# Patient Record
Sex: Female | Born: 1970 | Race: Black or African American | Hispanic: No | Marital: Married | Smoking: Never smoker
Health system: Southern US, Community
[De-identification: ages and names within clinical notes are randomized; demographics above are authoritative.]

## PROBLEM LIST (undated history)

## (undated) DIAGNOSIS — F419 Anxiety disorder, unspecified: Secondary | ICD-10-CM

## (undated) DIAGNOSIS — G709 Myoneural disorder, unspecified: Secondary | ICD-10-CM

## (undated) DIAGNOSIS — L309 Dermatitis, unspecified: Secondary | ICD-10-CM

## (undated) DIAGNOSIS — G43009 Migraine without aura, not intractable, without status migrainosus: Secondary | ICD-10-CM

## (undated) DIAGNOSIS — M069 Rheumatoid arthritis, unspecified: Secondary | ICD-10-CM

## (undated) DIAGNOSIS — H469 Unspecified optic neuritis: Secondary | ICD-10-CM

## (undated) DIAGNOSIS — M1990 Unspecified osteoarthritis, unspecified site: Secondary | ICD-10-CM

## (undated) DIAGNOSIS — I10 Essential (primary) hypertension: Secondary | ICD-10-CM

## (undated) HISTORY — DX: Unspecified optic neuritis: H46.9

## (undated) HISTORY — DX: Migraine without aura, not intractable, without status migrainosus: G43.009

## (undated) HISTORY — DX: Unspecified osteoarthritis, unspecified site: M19.90

## (undated) HISTORY — DX: Myoneural disorder, unspecified: G70.9

## (undated) HISTORY — DX: Rheumatoid arthritis, unspecified: M06.9

## (undated) HISTORY — DX: Dermatitis, unspecified: L30.9

## (undated) HISTORY — DX: Essential (primary) hypertension: I10

## (undated) HISTORY — DX: Anxiety disorder, unspecified: F41.9

---

## 2000-11-04 ENCOUNTER — Emergency Department (HOSPITAL_COMMUNITY): Admission: EM | Admit: 2000-11-04 | Discharge: 2000-11-05 | Payer: Self-pay | Admitting: Emergency Medicine

## 2004-04-12 ENCOUNTER — Ambulatory Visit: Payer: Self-pay | Admitting: Internal Medicine

## 2005-05-10 ENCOUNTER — Encounter: Admission: RE | Admit: 2005-05-10 | Discharge: 2005-05-10 | Payer: Self-pay | Admitting: Neurology

## 2006-07-21 ENCOUNTER — Inpatient Hospital Stay (HOSPITAL_COMMUNITY): Admission: AD | Admit: 2006-07-21 | Discharge: 2006-07-23 | Payer: Self-pay | Admitting: Obstetrics and Gynecology

## 2006-11-18 ENCOUNTER — Ambulatory Visit: Payer: Self-pay | Admitting: Internal Medicine

## 2006-11-18 ENCOUNTER — Encounter: Payer: Self-pay | Admitting: Internal Medicine

## 2006-11-18 DIAGNOSIS — G43009 Migraine without aura, not intractable, without status migrainosus: Secondary | ICD-10-CM | POA: Insufficient documentation

## 2006-11-18 DIAGNOSIS — H469 Unspecified optic neuritis: Secondary | ICD-10-CM | POA: Insufficient documentation

## 2006-12-19 ENCOUNTER — Ambulatory Visit: Payer: Self-pay | Admitting: Internal Medicine

## 2006-12-19 DIAGNOSIS — K5289 Other specified noninfective gastroenteritis and colitis: Secondary | ICD-10-CM

## 2006-12-19 DIAGNOSIS — I10 Essential (primary) hypertension: Secondary | ICD-10-CM | POA: Insufficient documentation

## 2006-12-23 ENCOUNTER — Telehealth (INDEPENDENT_AMBULATORY_CARE_PROVIDER_SITE_OTHER): Payer: Self-pay | Admitting: *Deleted

## 2006-12-25 ENCOUNTER — Telehealth (INDEPENDENT_AMBULATORY_CARE_PROVIDER_SITE_OTHER): Payer: Self-pay | Admitting: *Deleted

## 2007-11-18 ENCOUNTER — Ambulatory Visit: Payer: Self-pay | Admitting: Internal Medicine

## 2009-03-08 ENCOUNTER — Encounter: Payer: Self-pay | Admitting: Internal Medicine

## 2009-03-24 ENCOUNTER — Encounter: Payer: Self-pay | Admitting: Internal Medicine

## 2009-05-06 ENCOUNTER — Encounter: Payer: Self-pay | Admitting: Internal Medicine

## 2010-03-03 NOTE — Letter (Signed)
Summary: [facility]  [facility]   Imported By: Sherian Rein 03/14/2009 09:21:37  _____________________________________________________________________  External Attachment:    Type:   Image     Comment:   External Document

## 2010-03-03 NOTE — Letter (Signed)
Summary: [facility]  [facility]   Imported By: Lanelle Bal 05/12/2009 09:41:37  _____________________________________________________________________  External Attachment:    Type:   Image     Comment:   External Document

## 2010-03-03 NOTE — Letter (Signed)
Summary: [facility]  [facility]   Imported By: Sherian Rein 03/28/2009 14:12:12  _____________________________________________________________________  External Attachment:    Type:   Image     Comment:   External Document

## 2010-09-22 ENCOUNTER — Other Ambulatory Visit: Payer: Self-pay | Admitting: Neurology

## 2010-09-22 DIAGNOSIS — H4610 Retrobulbar neuritis, unspecified eye: Secondary | ICD-10-CM

## 2010-09-27 ENCOUNTER — Ambulatory Visit
Admission: RE | Admit: 2010-09-27 | Discharge: 2010-09-27 | Disposition: A | Discharge status: Home / Self Care | Payer: BC Managed Care – PPO | Source: Ambulatory Visit | Attending: Neurology | Admitting: Neurology

## 2010-09-27 DIAGNOSIS — H4610 Retrobulbar neuritis, unspecified eye: Secondary | ICD-10-CM

## 2010-09-27 MED ORDER — GADOBENATE DIMEGLUMINE 529 MG/ML IV SOLN
15.0000 mL | Freq: Once | INTRAVENOUS | Status: AC | PRN
  Administered 2010-09-27: 15 mL via INTRAVENOUS

## 2010-11-15 LAB — COMPREHENSIVE METABOLIC PANEL
ALT: 14
AST: 25
BUN: 4 — ABNORMAL LOW
Calcium: 9
Chloride: 103
GFR calc Af Amer: >60
Sodium: 135
Total Protein: 5 — ABNORMAL LOW

## 2010-11-15 LAB — CBC
HCT: 35.3 — ABNORMAL LOW
HCT: 36.7
HCT: 37.2
Hemoglobin: 12.2
Hemoglobin: 12.2
MCHC: 32.8
MCHC: 33.1
MCV: 88.1
Platelets: 190
Platelets: 218
RBC: 4.03
RBC: 4.23
RDW: 12.7
RDW: 13
RDW: 13.1
WBC: 10.3
WBC: 13.9 — ABNORMAL HIGH

## 2010-11-15 LAB — RPR: RPR Ser Ql: NONREACTIVE

## 2010-11-15 LAB — URIC ACID: Uric Acid, Serum: 4.8

## 2011-06-06 ENCOUNTER — Encounter: Payer: Self-pay | Admitting: Internal Medicine

## 2011-06-06 DIAGNOSIS — Z0000 Encounter for general adult medical examination without abnormal findings: Secondary | ICD-10-CM | POA: Insufficient documentation

## 2011-06-07 ENCOUNTER — Encounter: Payer: Self-pay | Admitting: Internal Medicine

## 2011-06-07 ENCOUNTER — Ambulatory Visit (INDEPENDENT_AMBULATORY_CARE_PROVIDER_SITE_OTHER): Payer: BC Managed Care – PPO | Admitting: Internal Medicine

## 2011-06-07 VITALS — BP 138/92 | HR 66 | Temp 98.5°F | Ht 63.5 in | Wt 159.0 lb

## 2011-06-07 DIAGNOSIS — Z0000 Encounter for general adult medical examination without abnormal findings: Secondary | ICD-10-CM

## 2011-06-07 DIAGNOSIS — M79609 Pain in unspecified limb: Secondary | ICD-10-CM

## 2011-06-07 DIAGNOSIS — F419 Anxiety disorder, unspecified: Secondary | ICD-10-CM | POA: Insufficient documentation

## 2011-06-07 DIAGNOSIS — I10 Essential (primary) hypertension: Secondary | ICD-10-CM

## 2011-06-07 DIAGNOSIS — M79645 Pain in left finger(s): Secondary | ICD-10-CM

## 2011-06-07 DIAGNOSIS — F411 Generalized anxiety disorder: Secondary | ICD-10-CM

## 2011-06-07 MED ORDER — NAPROXEN 500 MG PO TABS: 500.0000 mg | ORAL_TABLET | Freq: Two times a day (BID) | ORAL | Status: DC

## 2011-06-07 MED ORDER — HYDROCHLOROTHIAZIDE 25 MG PO TABS: 25.0000 mg | ORAL_TABLET | Freq: Every day | ORAL | Status: DC

## 2011-06-07 NOTE — Patient Instructions (Signed)
Take all new medications as prescribed Continue all other medications as before Please return in 1 mo with Lab testing done 3-5 days before  

## 2011-06-09 ENCOUNTER — Encounter: Payer: Self-pay | Admitting: Internal Medicine

## 2011-06-09 NOTE — Assessment & Plan Note (Signed)
stable overall by hx and exam, most recent data reviewed with pt, and pt to continue medical treatment as before Lab Results  Component Value Date   WBC 10.3 07/23/2006   HGB 12.2 07/23/2006   HCT 36.7 07/23/2006   PLT 190 07/23/2006   GLUCOSE 71 07/23/2006   ALT 14 07/23/2006   AST 25 07/23/2006   NA 135 07/23/2006   K 4.0 07/23/2006   CL 103 07/23/2006   CREATININE 0.59 07/23/2006   BUN 4* 07/23/2006   CO2 26 07/23/2006    

## 2011-06-09 NOTE — Assessment & Plan Note (Signed)
To start hctz 25 qd,  to f/u any worsening symptoms or concerns BP Readings from Last 3 Encounters:  06/07/11 138/92  11/18/07 156/102  12/19/06 141/91

## 2011-06-09 NOTE — Progress Notes (Signed)
  Subjective:    Patient ID: Debbie Graves, female    DOB: February 28, 1970, 41 y.o.   MRN: [id number]  HPI  Here to f/u; overall doing ok,  Pt denies chest pain, increased sob or doe, wheezing, orthopnea, PND, increased LE swelling, palpitations, dizziness or syncope but BP has been mild persistent elvated at 142/91 or higher, with occasional headaches.    Pt denies new neurological symptoms such as new facial or extremity weakness or numbness   Pt denies polydipsia, polyuria, or low sugar symptoms  Pt states overall good compliance with meds, trying to follow lower cholesterol diet, wt overall stable but little exercise however.  Does also have some pain to joints of several fingers of both hands.  Denies worsening depressive symptoms, suicidal ideation, or panic, though has ongoing anxiety, not increased recently.  Past Medical History  Diagnosis Date  . NEURITIS, OPTIC NOS 11/18/2006    Qualifier: Diagnosis of  By: Jonny Ruiz MD, Len Blalock   . Essential hypertension, benign 12/19/2006    Qualifier: Diagnosis of  By: Jonny Ruiz MD, Len Blalock   . COMMON MIGRAINE 11/18/2006    Qualifier: Diagnosis of  By: Jonny Ruiz MD, Len Blalock    No past surgical history on file.  reports that she has never smoked. She has never used smokeless tobacco. She reports that she does not drink alcohol or use illicit drugs. family history includes Breast cancer in an unspecified family member; Coronary artery disease in an unspecified family member; Hyperlipidemia in an unspecified family member; Hypertension in an unspecified family member; Lung cancer in an unspecified family member; Sarcoidosis in an unspecified family member; and Scleroderma in an unspecified family member. Allergies  Allergen Reactions  . Pseudoephedrine-Dm     REACTION: not sure   Current Outpatient Prescriptions on File Prior to Visit  Medication Sig Dispense Refill  . escitalopram (LEXAPRO) 10 MG tablet Take 10 mg by mouth daily.      . hydrochlorothiazide  (HYDRODIURIL) 25 MG tablet Take 1 tablet (25 mg total) by mouth daily.  90 tablet  3   Review of Systems Review of Systems  Constitutional: Negative for diaphoresis and unexpected weight change.   Eyes: Negative for photophobia and visual disturbance.  Respiratory: Negative for choking and stridor.   Gastrointestinal: Negative for vomiting and blood in stool.  Genitourinary: Negative for hematuria and decreased urine volume.  Musculoskeletal: Negative for gait problem.  Skin: Negative for color change and wound.  Neurological: Negative for tremors and numbness.  Psychiatric/Behavioral: Negative for decreased concentration. The patient is not hyperactive.       Objective:   Physical Exam BP 138/92  Pulse 66  Temp(Src) 98.5 F (36.9 C) (Oral)  Ht 5' 3.5" (1.613 m)  Wt 159 lb (72.122 kg)  BMI 27.72 kg/m2  SpO2 96% Physical Exam  VS noted, not ill appearing Constitutional: Pt appears well-developed and well-nourished.  HENT: Head: Normocephalic.  Right Ear: External ear normal.  Left Ear: External ear normal.  Eyes: Conjunctivae and EOM are normal. Pupils are equal, round, and reactive to light.  Neck: Normal range of motion. Neck supple.  Cardiovascular: Normal rate and regular rhythm.   Pulmonary/Chest: Effort normal and breath sounds normal.  Neurological: Pt is alert. Not confused Skin: Skin is warm. No erythema.  Several fingers both hands with bony and soft tissue swelling c/w DJD, mild tender Psychiatric: Pt behavior is normal. Thought content normal.     Assessment & Plan:

## 2011-06-09 NOTE — Assessment & Plan Note (Signed)
C/w DJD - for naproxen prn,  to f/u any worsening symptoms or concerns

## 2011-07-10 ENCOUNTER — Encounter: Payer: Self-pay | Admitting: Internal Medicine

## 2011-07-10 ENCOUNTER — Ambulatory Visit (INDEPENDENT_AMBULATORY_CARE_PROVIDER_SITE_OTHER): Payer: BC Managed Care – PPO | Admitting: Internal Medicine

## 2011-07-10 VITALS — BP 132/88 | HR 68 | Temp 97.0°F | Ht 63.0 in | Wt 156.2 lb

## 2011-07-10 DIAGNOSIS — I10 Essential (primary) hypertension: Secondary | ICD-10-CM

## 2011-07-10 DIAGNOSIS — M25569 Pain in unspecified knee: Secondary | ICD-10-CM

## 2011-07-10 DIAGNOSIS — M25562 Pain in left knee: Secondary | ICD-10-CM

## 2011-07-10 DIAGNOSIS — F419 Anxiety disorder, unspecified: Secondary | ICD-10-CM

## 2011-07-10 DIAGNOSIS — F411 Generalized anxiety disorder: Secondary | ICD-10-CM

## 2011-07-10 NOTE — Patient Instructions (Addendum)
Continue all other medications as before You can also take OTC tylenol arthritis as needed for pain as well

## 2011-07-15 DIAGNOSIS — M25562 Pain in left knee: Secondary | ICD-10-CM | POA: Insufficient documentation

## 2011-07-15 NOTE — Assessment & Plan Note (Signed)
stable overall by hx and exam, most recent data reviewed with pt, and pt to continue medical treatment as before BP Readings from Last 3 Encounters:  07/10/11 132/88  06/07/11 138/92  11/18/07 156/102

## 2011-07-15 NOTE — Assessment & Plan Note (Signed)
stable overall by hx and exam, most recent data reviewed with pt, and pt to continue medical treatment as before Lab Results  Component Value Date   WBC 10.3 07/23/2006   HGB 12.2 07/23/2006   HCT 36.7 07/23/2006   PLT 190 07/23/2006   GLUCOSE 71 07/23/2006   ALT 14 07/23/2006   AST 25 07/23/2006   NA 135 07/23/2006   K 4.0 07/23/2006   CL 103 07/23/2006   CREATININE 0.59 07/23/2006   BUN 4* 07/23/2006   CO2 26 07/23/2006

## 2011-07-15 NOTE — Assessment & Plan Note (Signed)
With tendonitis, mild, for tylenol prn,  to f/u any worsening symptoms or concerns

## 2011-07-15 NOTE — Progress Notes (Signed)
Subjective:    Patient ID: Debbie Graves, female    DOB: Dec 12, 1970, 41 y.o.   MRN: [id number]  HPI  Here to f/u - Denies worsening depressive symptoms, suicidal ideation, or panic. Pt denies chest pain, increased sob or doe, wheezing, orthopnea, PND, increased LE swelling, palpitations, dizziness or syncope.   Pt denies polydipsia, polyuria.  Pt denies new neurological symptoms such as new headache, or facial or extremity weakness or numbness.  Overall good compliance with treatment, and good medicine tolerability.  Does have mild left post knee lateral aspect tenderness and soreness to walk, and mild several hand joint pains as well.  Pt denies fever, wt loss, night sweats, loss of appetite, or other constitutional symptoms Past Medical History  Diagnosis Date  . NEURITIS, OPTIC NOS 11/18/2006    Qualifier: Diagnosis of  By: Jonny Ruiz MD, Len Blalock   . Essential hypertension, benign 12/19/2006    Qualifier: Diagnosis of  By: Jonny Ruiz MD, Len Blalock   . COMMON MIGRAINE 11/18/2006    Qualifier: Diagnosis of  By: Jonny Ruiz MD, Len Blalock    No past surgical history on file.  reports that she has never smoked. She has never used smokeless tobacco. She reports that she does not drink alcohol or use illicit drugs. family history includes Breast cancer in an unspecified family member; Coronary artery disease in an unspecified family member; Hyperlipidemia in an unspecified family member; Hypertension in an unspecified family member; Lung cancer in an unspecified family member; Sarcoidosis in an unspecified family member; and Scleroderma in an unspecified family member. Allergies  Allergen Reactions  . Pseudoephedrine-Dm     REACTION: not sure   Current Outpatient Prescriptions on File Prior to Visit  Medication Sig Dispense Refill  . escitalopram (LEXAPRO) 10 MG tablet Take 10 mg by mouth daily.      . hydrochlorothiazide (HYDRODIURIL) 25 MG tablet Take 1 tablet (25 mg total) by mouth daily.  90 tablet  3  .  naproxen (NAPROSYN) 500 MG tablet Take 1 tablet (500 mg total) by mouth 2 (two) times daily with a meal.  60 tablet  2   Review of Systems Constitutional: Negative for diaphoresis and unexpected weight change.  HENT: Negative for drooling and tinnitus.   Eyes: Negative for photophobia and visual disturbance.  Respiratory: Negative for choking and stridor.   Gastrointestinal: Negative for vomiting and blood in stool.  Genitourinary: Negative for hematuria and decreased urine volume.  Musculoskeletal: Negative for gait problem.  Skin: Negative for color change and wound.  Neurological: Negative for tremors and numbness.    Objective:   Physical Exam BP 132/88  Pulse 68  Temp 97 F (36.1 C) (Oral)  Ht 5\' 3"  (1.6 m)  Wt 156 lb 4 oz (70.875 kg)  BMI 27.68 kg/m2  SpO2 98% Physical Exam  VS noted Constitutional: Pt appears well-developed and well-nourished.  HENT: Head: Normocephalic.  Right Ear: External ear normal.  Left Ear: External ear normal.  Eyes: Conjunctivae and EOM are normal. Pupils are equal, round, and reactive to light.  Neck: Normal range of motion. Neck supple.  Cardiovascular: Normal rate and regular rhythm.   Pulmonary/Chest: Effort normal and breath sounds normal.  Abd:  Soft, NT, non-distended, + BS Neurological: Pt is alert. Not confused Skin: Skin is warm. No erythema. No rash Psychiatric: Pt behavior is normal. Thought content normal. 1+ nervous, not depressed Left post knee lateral ligamentous complex mild tender at insertion site Several joints to fingers with typical bony deg  changes, no active synovitis    Assessment & Plan:

## 2012-04-18 ENCOUNTER — Encounter: Payer: Self-pay | Admitting: Neurology

## 2012-04-18 ENCOUNTER — Ambulatory Visit (INDEPENDENT_AMBULATORY_CARE_PROVIDER_SITE_OTHER): Payer: BC Managed Care – PPO | Admitting: Neurology

## 2012-04-18 VITALS — BP 121/78 | HR 64 | Ht 65.75 in | Wt 156.0 lb

## 2012-04-18 DIAGNOSIS — G43009 Migraine without aura, not intractable, without status migrainosus: Secondary | ICD-10-CM

## 2012-04-18 DIAGNOSIS — H469 Unspecified optic neuritis: Secondary | ICD-10-CM

## 2012-04-18 DIAGNOSIS — M069 Rheumatoid arthritis, unspecified: Secondary | ICD-10-CM

## 2012-04-18 NOTE — Patient Instructions (Signed)
   We will check a MRI of the brain and some blood work. 

## 2012-04-18 NOTE — Progress Notes (Signed)
   Reason for visit: Left optic neuritis  Debbie Graves is an 42 y.o. female  History of present illness:  Debbie Graves is a 42 year old right-handed black female with a history of a left sided optic neuritis. The patient also has a history of migraine headaches. The patient has had intermittent blurring of vision involving the right eye, and visual evoked response tests in the past have been unremarkable on this side. Patient recently was diagnosed with rheumatoid arthritis secondary to swelling involving the right index finger. In the past, the patient has had a mildly positive ANA, and SSA antibody. The patient reports no new numbness or weakness of the face, arms, or legs. The patient denies any balance issues. The patient returns for an evaluation.    ROS:  Out of a complete 14 system review of symptoms, the patient complains only of the following symptoms, and all other reviewed systems are negative.  Rash Blurred vision Joint pain Joint swelling Memory loss Headache Anxiety   Blood pressure 121/78, pulse 64, height 5' 5.75" (1.67 m), weight 156 lb (70.761 kg).  Physical Exam  General: The patient is alert and cooperative at the time of the examination.  Skin: No significant peripheral edema is noted.   Neurologic Exam  Cranial nerves: Facial symmetry is present. Speech is normal, no aphasia or dysarthria is noted. Extraocular movements are full. Visual fields are full. Pupils are equal, round, and reactive to light. Discs are flat bilaterally.  Motor: The patient has good strength in all 4 extremities.  Coordination: The patient has good finger-nose-finger and heel-to-shin bilaterally.  Gait and station: The patient has a normal gait. Tandem gait is normal. Romberg is negative. No drift is seen.  Reflexes: Deep tendon reflexes are symmetric.   Assessment/Plan:  One. Left optic neuritis  2. Rheumatoid arthritis  The patient will be set up for MRI evaluation  of the brain once again. If this is unremarkable, I would not pursue any further workup in the future. The patient is now 7 years out from her initial episode of optic neuritis. The patient will have some blood work done today. If the ANA and SSA antibodies are elevating, a referral to a rheumatologist may be in order. The patient will followup if needed.   Marlan Palau MD 04/18/2012 10:15 AM

## 2012-04-19 ENCOUNTER — Telehealth: Payer: Self-pay | Admitting: Neurology

## 2012-04-19 ENCOUNTER — Encounter: Payer: Self-pay | Admitting: Neurology

## 2012-04-19 LAB — SJOGRENS SYNDROME-B EXTRACTABLE NUCLEAR ANTIBODY: ENA SSB (LA) Ab: <0.2 AI (ref 0.0–0.9)

## 2012-04-19 LAB — SJOGRENS SYNDROME-A EXTRACTABLE NUCLEAR ANTIBODY: ENA SSA (RO) Ab: 3.3 AI — ABNORMAL HIGH (ref 0.0–0.9)

## 2012-04-19 LAB — ANA: Anti Nuclear Antibody(ANA): POSITIVE — AB

## 2012-04-19 NOTE — Telephone Encounter (Signed)
I called the patient. The ANA and the SSA antibodies are positive, the SSB antibody is negative in low titer. No change from before. No clear of the significance. If her joint problems return, an rheumatology eval may be in order. MRI of the brain is pending.

## 2012-04-23 ENCOUNTER — Ambulatory Visit
Admission: RE | Admit: 2012-04-23 | Discharge: 2012-04-23 | Disposition: A | Discharge status: Home / Self Care | Payer: BC Managed Care – PPO | Source: Ambulatory Visit | Attending: Neurology | Admitting: Neurology

## 2012-04-23 DIAGNOSIS — H469 Unspecified optic neuritis: Secondary | ICD-10-CM

## 2012-04-23 DIAGNOSIS — H4610 Retrobulbar neuritis, unspecified eye: Secondary | ICD-10-CM

## 2012-04-23 MED ORDER — GADOBENATE DIMEGLUMINE 529 MG/ML IV SOLN
14.0000 mL | Freq: Once | INTRAVENOUS | Status: AC | PRN
  Administered 2012-04-23: 14 mL via INTRAVENOUS

## 2012-04-24 ENCOUNTER — Telehealth: Payer: Self-pay | Admitting: Neurology

## 2012-04-24 NOTE — Telephone Encounter (Signed)
I called patient. The MRI study of the brain is normal. The patient is now 7 years out from the optic neuritis, no indication for continuing to follow this patient. The patient will contact me if she has any further issues.

## 2012-06-03 ENCOUNTER — Other Ambulatory Visit: Payer: Self-pay | Admitting: Internal Medicine

## 2012-07-31 ENCOUNTER — Other Ambulatory Visit: Payer: Self-pay | Admitting: Internal Medicine

## 2012-11-04 ENCOUNTER — Ambulatory Visit: Payer: BC Managed Care – PPO | Admitting: Internal Medicine

## 2012-11-05 ENCOUNTER — Encounter: Payer: Self-pay | Admitting: Internal Medicine

## 2012-11-05 ENCOUNTER — Ambulatory Visit (INDEPENDENT_AMBULATORY_CARE_PROVIDER_SITE_OTHER): Payer: BC Managed Care – PPO | Admitting: Internal Medicine

## 2012-11-05 VITALS — BP 142/90 | Temp 97.6°F | Ht 63.5 in | Wt 165.0 lb

## 2012-11-05 DIAGNOSIS — R209 Unspecified disturbances of skin sensation: Secondary | ICD-10-CM

## 2012-11-05 DIAGNOSIS — G4710 Hypersomnia, unspecified: Secondary | ICD-10-CM

## 2012-11-05 DIAGNOSIS — J019 Acute sinusitis, unspecified: Secondary | ICD-10-CM

## 2012-11-05 DIAGNOSIS — R202 Paresthesia of skin: Secondary | ICD-10-CM | POA: Insufficient documentation

## 2012-11-05 DIAGNOSIS — I10 Essential (primary) hypertension: Secondary | ICD-10-CM

## 2012-11-05 DIAGNOSIS — R5381 Other malaise: Secondary | ICD-10-CM | POA: Insufficient documentation

## 2012-11-05 MED ORDER — AZITHROMYCIN 250 MG PO TABS: ORAL_TABLET | ORAL | Status: DC

## 2012-11-05 MED ORDER — HYDROCODONE-HOMATROPINE 5-1.5 MG/5ML PO SYRP: 5.0000 mL | ORAL_SOLUTION | Freq: Four times a day (QID) | ORAL | Status: DC | PRN

## 2012-11-05 MED ORDER — HYDROCHLOROTHIAZIDE 25 MG PO TABS: 25.0000 mg | ORAL_TABLET | Freq: Every day | ORAL | Status: DC

## 2012-11-05 NOTE — Assessment & Plan Note (Signed)
?   [facility] , for pulm referral

## 2012-11-05 NOTE — Assessment & Plan Note (Signed)
stable overall by history and exam, recent data reviewed with pt, and pt to continue medical treatment as before,  to f/u any worsening symptoms or concerns BP Readings from Last 3 Encounters:  11/05/12 142/90  04/18/12 121/78  07/10/11 132/88

## 2012-11-05 NOTE — Assessment & Plan Note (Signed)
Mild to mod, for antibx course,  to f/u any worsening symptoms or concerns 

## 2012-11-05 NOTE — Patient Instructions (Signed)
Please take all new medication as prescribed - the antibiotic Please continue all other medications as before Please have the pharmacy call with any other refills you may need. Please go to the LAB in the Basement (turn left off the elevator) for the tests to be done today You will be contacted by phone if any changes need to be made immediately.  Otherwise, you will receive a letter about your results with an explanation, but please check with MyChart first.  You will be contacted regarding the referral for: pulmonary to see about possible sleep apnea  Please wear your right wrist splint at home at night to hopefully then help during the day  Please return in 6 months, or sooner if needed

## 2012-11-05 NOTE — Progress Notes (Signed)
Subjective:    Patient ID: Debbie Graves, female    DOB: 22-Mar-1970, 42 y.o.   MRN: [id number]  HPI   Here with 2-3 days acute onset fever, facial pain, pressure, headache, general weakness and malaise, and greenish d/c, with mild ST and cough, but pt denies chest pain, wheezing, increased sob or doe, orthopnea, PND, increased LE swelling, palpitations, dizziness or syncope.  Does c/o ongoing fatigue, and has signficant daytime hypersomnolence, only able to take naps on the weekends when not working.No falling asleep driving.  Husbnad has to shake her at night.  Also with intermittent right hand numbness and mild wrist discomfort with typing at work, not better with right wrist splint but has only been wearing during the day when has symptoms Past Medical History  Diagnosis Date  . NEURITIS, OPTIC NOS 11/18/2006    Qualifier: Diagnosis of  By: Jonny Ruiz MD, Len Blalock   . Essential hypertension, benign 12/19/2006    Qualifier: Diagnosis of  By: Jonny Ruiz MD, Len Blalock   . Rheumatoid arthritis(714.0) 04/18/2012  . COMMON MIGRAINE 11/18/2006    Qualifier: Diagnosis of  By: Jonny Ruiz MD, Len Blalock    No past surgical history on file.  reports that she has never smoked. She has never used smokeless tobacco. She reports that she drinks alcohol. She reports that she does not use illicit drugs. family history includes Breast cancer in an other family member; Coronary artery disease in an other family member; Heart disease in her daughter; Hyperlipidemia in an other family member; Hypertension in an other family member; Lung cancer in an other family member; Sarcoidosis in her mother and another family member; Scleroderma in her father and another family member. Allergies  Allergen Reactions  . Pseudoephedrine-Dm     REACTION: not sure   Current Outpatient Prescriptions on File Prior to Visit  Medication Sig Dispense Refill  . escitalopram (LEXAPRO) 10 MG tablet Take 10 mg by mouth daily.      . hydrochlorothiazide  (HYDRODIURIL) 25 MG tablet TAKE 1 TABLET (25 MG TOTAL) BY MOUTH DAILY.  90 tablet  1  . levonorgestrel (MIRENA) 20 MCG/24HR IUD 1 each by Intrauterine route once.       No current facility-administered medications on file prior to visit.   Review of Systems  Constitutional: Negative for unexpected weight change, or unusual diaphoresis  HENT: Negative for tinnitus.   Eyes: Negative for photophobia and visual disturbance.  Respiratory: Negative for choking and stridor.   Gastrointestinal: Negative for vomiting and blood in stool.  Genitourinary: Negative for hematuria and decreased urine volume.  Musculoskeletal: Negative for acute joint swelling Skin: Negative for color change and wound.  Neurological: Negative for tremors and numbness other than noted  Psychiatric/Behavioral: Negative for decreased concentration or  hyperactivity.       Objective:   Physical Exam BP 142/90  Temp(Src) 97.6 F (36.4 C) (Oral)  Ht 5' 3.5" (1.613 m)  Wt 165 lb (74.844 kg)  BMI 28.77 kg/m2  SpO2 97% VS noted,  Constitutional: Pt appears well-developed and well-nourished.  HENT: Head: NCAT.  Right Ear: External ear normal.  Left Ear: External ear normal.  Bilat tm's with mild erythema.  Max sinus areas mild tender.  Pharynx with mild erythema, no exudate Eyes: Conjunctivae and EOM are normal. Pupils are equal, round, and reactive to light.  Neck: Normal range of motion. Neck supple.  Cardiovascular: Normal rate and regular rhythm.   Pulmonary/Chest: Effort normal and breath sounds normal.  Neurological: Pt  is alert. Not confused , motor/sens/dtr intact to RUE Skin: Skin is warm. No erythema.  Psychiatric: Pt behavior is normal. Thought content normal.     Assessment & Plan:

## 2012-11-05 NOTE — Assessment & Plan Note (Signed)
prob mild CTS, for right wrist splint at night,  to f/u any worsening symptoms or concerns

## 2012-11-13 ENCOUNTER — Ambulatory Visit (INDEPENDENT_AMBULATORY_CARE_PROVIDER_SITE_OTHER): Payer: BC Managed Care – PPO | Admitting: Internal Medicine

## 2012-11-13 ENCOUNTER — Encounter: Payer: Self-pay | Admitting: Internal Medicine

## 2012-11-13 VITALS — BP 132/98 | HR 76 | Temp 98.1°F

## 2012-11-13 DIAGNOSIS — J019 Acute sinusitis, unspecified: Secondary | ICD-10-CM

## 2012-11-13 DIAGNOSIS — R51 Headache: Secondary | ICD-10-CM

## 2012-11-13 DIAGNOSIS — R110 Nausea: Secondary | ICD-10-CM

## 2012-11-13 MED ORDER — METHYLPREDNISOLONE ACETATE 80 MG/ML IJ SUSP
80.0000 mg | Freq: Once | INTRAMUSCULAR | Status: AC
  Administered 2012-11-13: 80 mg via INTRAMUSCULAR

## 2012-11-13 MED ORDER — MOXIFLOXACIN HCL 400 MG PO TABS: 400.0000 mg | ORAL_TABLET | Freq: Every day | ORAL | Status: DC

## 2012-11-13 MED ORDER — PROMETHAZINE HCL 25 MG PO TABS: 25.0000 mg | ORAL_TABLET | Freq: Four times a day (QID) | ORAL | Status: DC | PRN

## 2012-11-13 NOTE — Progress Notes (Signed)
Pre-visit discussion using our clinic review tool. No additional management support is needed unless otherwise documented below in the visit note.  

## 2012-11-13 NOTE — Progress Notes (Signed)
  Subjective:    HPI  complains of head cold symptoms, ?sinusitus Onset >1 week ago, initially improved then relapsing and worse symptoms  First associated with rhinorrhea, sneezing, sore throat, mild headache and low grade fever Now sinus pressure and mild-mod nasal congestion, yellow-green discharge No relief with OTC meds Precipitated by sick contacts and weather change  Past Medical History  Diagnosis Date  . NEURITIS, OPTIC NOS   . Essential hypertension, benign   . Rheumatoid arthritis(714.0)   . COMMON MIGRAINE     Review of Systems Constitutional: No night sweats, no unexpected weight change Pulmonary: No pleurisy or hemoptysis Cardiovascular: No chest pain or palpitations     Objective:   Physical Exam BP 132/98  Pulse 76  Temp(Src) 98.1 F (36.7 C) (Oral)  SpO2 99% GEN: mildly ill appearing and audible head/chest congestion HENT: NCAT, mod sinus tenderness across maxillary region bilaterally, nares without discharge but L>R turbinate swelling, oropharynx mild erythema and PND, no exudate Eyes: Vision grossly intact, no conjunctivitis Lungs: Clear to auscultation without rhonchi or wheeze, no increased work of breathing Cardiovascular: Regular rate and rhythm, no bilateral edema  Lab Results  Component Value Date   WBC 10.3 07/23/2006   HGB 12.2 07/23/2006   HCT 36.7 07/23/2006   PLT 190 07/23/2006   GLUCOSE 71 07/23/2006   ALT 14 07/23/2006   AST 25 07/23/2006   NA 135 07/23/2006   K 4.0 07/23/2006   CL 103 07/23/2006   CREATININE 0.59 07/23/2006   BUN 4* 07/23/2006   CO2 26 07/23/2006      Assessment & Plan:  Acute maxillary sinusitis  Headache, related to same - neuro exam benign  Nausea, related to above   ------- IM medrol 80g today  Different empiric antibiotics prescribed due to symptom duration greater than 7 days and progression despite symptomatic care  Promethazine every 6 hours as needed for nausea - erx  Symptomatic care with Tylenol or  Advil, decongestants, antihistamine, hydration and rest -   Saline irrigation and salt gargle advised as needed

## 2012-11-13 NOTE — Patient Instructions (Addendum)
It was good to see you today.  We have reviewed your prior records including labs and tests today  Medrol injection given to you today to help with inflammation and reduce pain  Avelox antibiotics to treat sinus infection -one daily for 10 days Promethazine every 6 hours as needed for nausea  Your prescription(s) have been submitted to your pharmacy. Please take as directed and contact our office if you believe you are having problem(s) with the medication(s).  Call if symptoms unimproved in next 5-7 days, sooner if worse  Sinus Headache A sinus headache is when your sinuses become clogged or swollen. Sinus headaches can range from mild to severe.  CAUSES A sinus headache can have different causes, such as:  Colds.  Sinus infections.  Allergies. SYMPTOMS  Symptoms of a sinus headache may vary and can include:  Headache.  Pain or pressure in the face.  Congested or runny nose.  Fever.  Inability to smell.  Pain in upper teeth. Weather changes can make symptoms worse. TREATMENT  The treatment of a sinus headache depends on the cause.  Sinus pain caused by a sinus infection may be treated with antibiotic medicine.  Sinus pain caused by allergies may be helped by allergy medicines (antihistamines) and medicated nasal sprays.  Sinus pain caused by congestion may be helped by flushing the nose and sinuses with saline solution. HOME CARE INSTRUCTIONS   If antibiotics are prescribed, take them as directed. Finish them even if you start to feel better.  Only take over-the-counter or prescription medicines for pain, discomfort, or fever as directed by your caregiver.  If you have congestion, use a nasal spray to help reduce pressure. SEEK IMMEDIATE MEDICAL CARE IF:  You have a fever.  You have headaches more than once a week.  You have sensitivity to light or sound.  You have repeated nausea and vomiting.  You have vision problems.  You have sudden, severe pain  in your face or head.  You have a seizure.  You are confused.  Your sinus headaches do not get better after treatment. Many people think they have a sinus headache when they actually have migraines or tension headaches. MAKE SURE YOU:   Understand these instructions.  Will watch your condition.  Will get help right away if you are not doing well or get worse. Document Released: 02/23/2004 Document Revised: 04/09/2011 Document Reviewed: 04/15/2010 [facility] Patient Information 2014 [place], [place].

## 2012-11-13 NOTE — Assessment & Plan Note (Signed)
Increasing bilateral maxillary pressure associated with headache Afebrile, significant turbinate swelling on exam and tenderness to palpation  Treat with Medrol 80 mg as anti-inflammatory Avelox 400 mg daily x10 days  Also provided promethazine to use as needed for nausea  Patient agrees to call symptoms worse or unimproved in next 5-7 days

## 2012-12-15 ENCOUNTER — Institutional Professional Consult (permissible substitution): Payer: BC Managed Care – PPO | Admitting: Pulmonary Disease

## 2013-06-08 ENCOUNTER — Emergency Department (HOSPITAL_COMMUNITY)
Admission: EM | Admit: 2013-06-08 | Discharge: 2013-06-08 | Disposition: A | Discharge status: Home / Self Care | Payer: BC Managed Care – PPO | Attending: Emergency Medicine | Admitting: Emergency Medicine

## 2013-06-08 ENCOUNTER — Emergency Department (HOSPITAL_COMMUNITY): Payer: BC Managed Care – PPO

## 2013-06-08 ENCOUNTER — Encounter (HOSPITAL_COMMUNITY): Payer: Self-pay | Admitting: Emergency Medicine

## 2013-06-08 ENCOUNTER — Telehealth: Payer: Self-pay | Admitting: Internal Medicine

## 2013-06-08 DIAGNOSIS — I10 Essential (primary) hypertension: Secondary | ICD-10-CM | POA: Insufficient documentation

## 2013-06-08 DIAGNOSIS — Z79899 Other long term (current) drug therapy: Secondary | ICD-10-CM | POA: Insufficient documentation

## 2013-06-08 DIAGNOSIS — Z3202 Encounter for pregnancy test, result negative: Secondary | ICD-10-CM | POA: Insufficient documentation

## 2013-06-08 DIAGNOSIS — Z792 Long term (current) use of antibiotics: Secondary | ICD-10-CM | POA: Insufficient documentation

## 2013-06-08 DIAGNOSIS — M940 Chondrocostal junction syndrome [Tietze]: Secondary | ICD-10-CM | POA: Insufficient documentation

## 2013-06-08 DIAGNOSIS — R079 Chest pain, unspecified: Secondary | ICD-10-CM

## 2013-06-08 DIAGNOSIS — Z8739 Personal history of other diseases of the musculoskeletal system and connective tissue: Secondary | ICD-10-CM | POA: Insufficient documentation

## 2013-06-08 DIAGNOSIS — Z8669 Personal history of other diseases of the nervous system and sense organs: Secondary | ICD-10-CM | POA: Insufficient documentation

## 2013-06-08 LAB — BASIC METABOLIC PANEL
BUN: 15 mg/dL (ref 6–23)
CALCIUM: 9.2 mg/dL (ref 8.4–10.5)
CHLORIDE: 100 meq/L (ref 96–112)
CO2: 31 meq/L (ref 19–32)
CREATININE: 0.79 mg/dL (ref 0.50–1.10)
GFR calc Af Amer: >90 mL/min (ref >90)
GFR calc non Af Amer: >90 mL/min (ref >90)
Glucose, Bld: 100 mg/dL — ABNORMAL HIGH (ref 70–99)
Potassium: 3.9 mEq/L (ref 3.7–5.3)
Sodium: 139 mEq/L (ref 137–147)

## 2013-06-08 LAB — CBC
HEMATOCRIT: 39.7 % (ref 36.0–46.0)
Hemoglobin: 13 g/dL (ref 12.0–15.0)
MCH: 28.5 pg (ref 26.0–34.0)
MCHC: 32.7 g/dL (ref 30.0–36.0)
MCV: 87.1 fL (ref 78.0–100.0)
Platelets: 219 10*3/uL (ref 150–400)
RBC: 4.56 MIL/uL (ref 3.87–5.11)
RDW: 12.4 % (ref 11.5–15.5)
WBC: 7.4 10*3/uL (ref 4.0–10.5)

## 2013-06-08 LAB — I-STAT TROPONIN, ED: Troponin i, poc: 0 ng/mL (ref 0.00–0.08)

## 2013-06-08 LAB — PRO B NATRIURETIC PEPTIDE: Pro B Natriuretic peptide (BNP): 29.1 pg/mL (ref 0–125)

## 2013-06-08 LAB — PREGNANCY, URINE: PREG TEST UR: NEGATIVE

## 2013-06-08 MED ORDER — NAPROXEN 500 MG PO TABS: 500.0000 mg | ORAL_TABLET | Freq: Two times a day (BID) | ORAL | Status: DC

## 2013-06-08 NOTE — Telephone Encounter (Signed)
Patient Information:  Caller Name: Debbie Graves  Phone: [phone]  Patient: Debbie Graves, Debbie Graves  Gender: Female  DOB: 06-28-1970  Age: 43 Years  PCP: Cathlean Cower (Adults only)  Pregnant: No  Office Follow Up:  Does the office need to follow up with this patient?: No  Instructions For The Office: N/A  RN Note:  Caller advised to call 911 per disposition;  She states she is right across the street from the er so she will drive herself over for evaluation.  Symptoms  Reason For Call & Symptoms: Chest Pain:  At time of onset it was to the top of her left side over her heart area;  It lasted about 30 minutes, it felt like a tightness;  It didn't come back until this am 5/11 around 9 am and it is to her right side under the breast;  It is a constant, heavy feeling;  Turning, laughing makes it worse but it is present no matter what she is doing;  She denies any cough or congestion recently; Denies any injury.  Reviewed Health History In EMR: Yes  Reviewed Medications In EMR: Yes  Reviewed Allergies In EMR: Yes  Reviewed Surgeries / Procedures: Yes  Date of Onset of Symptoms: 06/06/2013 OB / GYN:  LMP: Unknown  Guideline(s) Used:  Chest Pain  Disposition Per Guideline:   Call EMS 911 Now  Reason For Disposition Reached:   Chest pain lasting longer than 5 minutes and ANY of the following:  Over 17 years old Over 61 years old and at least one cardiac risk factor (i.e., high blood pressure, diabetes, high cholesterol, obesity, smoker or strong family history of heart disease) Pain is crushing, pressure-like, or heavy  Took nitroglycerin and chest pain was not relieved History of heart disease (i.e., angina, heart attack, bypass surgery, angioplasty, CHF)  Advice Given:  N/A  Patient Refused Recommendation:  Patient Will Go To ED  She is across the street from the er and will drive herself there.

## 2013-06-08 NOTE — Telephone Encounter (Signed)
Noted. Dr Linna Darner did see this

## 2013-06-08 NOTE — Discharge Instructions (Signed)
Call for a follow up appointment with a Family or Primary Care Provider.  Return if Symptoms worsen, shortness of breath, cough, palpitations, lower extremity swelling. Take medication as prescribed.  You can Ice your chest wall 3-4 times a day.

## 2013-06-08 NOTE — ED Notes (Signed)
Pt states that on Saturday pt had chest pain that last about 30 mins with dizziness that lasted about an hour.  Pt states that today she has mid chest pain that started this am and been constant since. Pt denies dizziness, n/v today with chest pain.

## 2013-06-08 NOTE — ED Provider Notes (Signed)
CSN: [id number]     Arrival date & time 06/08/13  1459 History   First MD Initiated Contact with Patient 06/08/13 1610     Chief Complaint  Patient presents with  . chest pain      (Consider location/radiation/quality/duration/timing/severity/associated sxs/prior Treatment) HPI Comments: Is a 43 year old female past medical history of hypertension, presenting to the emergency room with chest discomfort since 0900 today. The patient reports discomfort is rated 1-2/10, low sternal, constant, non radiating. Aggravating factors include: deep expiration, movement, laughing pressure.  Denies dyspnea.  Denies history of murmur, previous MI, arrythmia, or family history of early MI. No recent travel, family history or personal history of DVT/PE, lower extremity swelling, smoking, cancer. Patient has Mirena in place.   The history is provided by the patient and a significant other. No language interpreter was used.    Past Medical History  Diagnosis Date  . NEURITIS, OPTIC NOS   . Essential hypertension, benign   . Rheumatoid arthritis(714.0)   . COMMON MIGRAINE    History reviewed. No pertinent past surgical history. Family History  Problem Relation Age of Onset  . Coronary artery disease    . Hyperlipidemia    . Hypertension    . Lung cancer    . Breast cancer    . Scleroderma    . Sarcoidosis    . Sarcoidosis Mother   . Scleroderma Father   . Heart disease Daughter    History  Substance Use Topics  . Smoking status: Never Smoker   . Smokeless tobacco: Never Used  . Alcohol Use: Yes     Comment: Occasional   OB History   Grav Para Term Preterm Abortions TAB SAB Ect Mult Living                 Review of Systems  Constitutional: Negative for chills.  Respiratory: Negative for cough and shortness of breath.   Cardiovascular: Positive for chest pain. Negative for palpitations and leg swelling.  Gastrointestinal: Negative for nausea, vomiting, abdominal pain and diarrhea.    Musculoskeletal: Negative for back pain.      Allergies  Dimetapp decongestant and Pseudoephedrine-dm  Home Medications   Prior to Admission medications   Medication Sig Start Date End Date Taking? Authorizing Provider  Doxycycline Hyclate (ACTICLATE) 150 MG TABS Take 150 mg by mouth daily.   Yes Historical Provider, MD  escitalopram (LEXAPRO) 10 MG tablet Take 10 mg by mouth daily.   Yes Historical Provider, MD  hydrochlorothiazide (HYDRODIURIL) 25 MG tablet Take 1 tablet (25 mg total) by mouth daily. 11/05/12  Yes Biagio Borg, MD  levonorgestrel (MIRENA) 20 MCG/24HR IUD 1 each by Intrauterine route once.    Historical Provider, MD   BP 133/83  Pulse 77  Temp(Src) 98.2 F (36.8 C) (Oral)  Resp 23  SpO2 100% Physical Exam  Nursing note and vitals reviewed. Constitutional: She is oriented to person, place, and time. She appears well-developed and well-nourished. No distress.  HENT:  Head: Normocephalic and atraumatic.  Eyes: EOM are normal. Pupils are equal, round, and reactive to light. Right eye exhibits no discharge. No scleral icterus.  Neck: Normal range of motion. Neck supple.  Cardiovascular: Normal rate, regular rhythm and normal heart sounds.   No murmur heard. No lower extremity edema  Pulmonary/Chest: Effort normal and breath sounds normal. No respiratory distress. She has no decreased breath sounds. She has no wheezes. She has no rhonchi. She has no rales. She exhibits tenderness.  Patient is able to speak in complete sentences. No tachypnea on exam.  Abdominal: Soft. Bowel sounds are normal. She exhibits no distension. There is no tenderness. There is no rebound and no guarding.  Musculoskeletal: Normal range of motion. She exhibits no edema.  Neurological: She is alert and oriented to person, place, and time.  Skin: Skin is warm and dry. No rash noted. She is not diaphoretic.  Psychiatric: She has a normal mood and affect. Her behavior is normal.    ED  Course  Procedures (including critical care time) Labs Review Labs Reviewed  BASIC METABOLIC PANEL - Abnormal; Notable for the following:    Glucose, Bld 100 (*)    All other components within normal limits  CBC  PRO B NATRIURETIC PEPTIDE  PREGNANCY, URINE  I-STAT TROPOININ, ED    Imaging Review Dg Chest Port 1 View  06/08/2013   CLINICAL DATA:  Chest pain  EXAM: PORTABLE CHEST - 1 VIEW  COMPARISON:  None.  FINDINGS: 1543 hrs. The lungs are clear without focal infiltrate, edema, pneumothorax or pleural effusion. The cardiopericardial silhouette is within normal limits for size. Imaged bony structures of the thorax are intact. Telemetry leads overlie the chest.  IMPRESSION: No acute cardiopulmonary findings.   Electronically Signed   By: Misty Stanley M.D.   On: 06/08/2013 15:58     Date: 06/08/2013  Rate: 79  Rhythm: normal sinus rhythm  QRS Axis: normal  Intervals: normal  ST/T Wave abnormalities: normal  Conduction Disutrbances:none  Narrative Interpretation:   Old EKG Reviewed:       MDM   Final diagnoses:  Chest pain  Costochondritis   Patient with atypical chest pain, reproducible with palpation. Low-risk of ACS. EKG without abnormal findings. Troponin negative, CBC and BMP unremarkable. BNP negative, d-dimer negative. Chest x-ray without acute findings. The patient declined medication in the ED. Onset of discomfort was at 0900, troponin was obtained at 1552, greater than 6 hour troponin. Will not repeat at this time, because of low risk, pain reproducible with palpation, negative EKG and workup thus far. Reeval patient resting comfortably in room. Discussed results with the patient. Discussed lab results, imaging results, and treatment plan with the patient. Return precautions given. Reports understanding and no other concerns at this time.  Patient is stable for discharge at this time.  Meds given in ED:  Medications - No data to display  Discharge Medication List as  of 06/08/2013  5:30 PM    START taking these medications   Details  naproxen (NAPROSYN) 500 MG tablet Take 1 tablet (500 mg total) by mouth 2 (two) times daily. Take with food, Starting 06/08/2013, Until Discontinued, Humphrey, PA-C 06/10/13 [id number]

## 2013-06-12 NOTE — ED Provider Notes (Signed)
Medical screening examination/treatment/procedure(s) were performed by non-physician practitioner and as supervising physician I was immediately available for consultation/collaboration.   EKG Interpretation   Date/Time:  Monday Jun 08 2013 15:14:27 EDT Ventricular Rate:  79 PR Interval:  165 QRS Duration: 74 QT Interval:  368 QTC Calculation: 422 R Axis:   67 Text Interpretation:  Sinus rhythm ED PHYSICIAN INTERPRETATION AVAILABLE  IN [facility] [place] Confirmed by TEST, Record ([id number]) on 06/10/2013 6:51:53  AM       Jasper Riling. Alvino Chapel, [place] 06/12/13 [phone]

## 2014-01-09 ENCOUNTER — Other Ambulatory Visit: Payer: Self-pay | Admitting: Internal Medicine

## 2014-04-07 ENCOUNTER — Other Ambulatory Visit: Payer: Self-pay | Admitting: Internal Medicine

## 2014-04-19 ENCOUNTER — Other Ambulatory Visit: Payer: Self-pay | Admitting: Internal Medicine

## 2014-04-27 ENCOUNTER — Other Ambulatory Visit: Payer: Self-pay | Admitting: Internal Medicine

## 2014-04-28 ENCOUNTER — Encounter: Payer: Self-pay | Admitting: Internal Medicine

## 2014-04-28 ENCOUNTER — Ambulatory Visit (INDEPENDENT_AMBULATORY_CARE_PROVIDER_SITE_OTHER): Payer: BC Managed Care – PPO | Admitting: Internal Medicine

## 2014-04-28 VITALS — BP 126/88 | HR 65 | Temp 98.2°F | Resp 18 | Ht 63.5 in | Wt 167.1 lb

## 2014-04-28 DIAGNOSIS — I10 Essential (primary) hypertension: Secondary | ICD-10-CM

## 2014-04-28 DIAGNOSIS — Z0000 Encounter for general adult medical examination without abnormal findings: Secondary | ICD-10-CM

## 2014-04-28 DIAGNOSIS — M069 Rheumatoid arthritis, unspecified: Secondary | ICD-10-CM

## 2014-04-28 MED ORDER — HYDROCHLOROTHIAZIDE 25 MG PO TABS: ORAL_TABLET | ORAL | Status: DC

## 2014-04-28 NOTE — Progress Notes (Signed)
Subjective:    Patient ID: Debbie Graves, female    DOB: 1971/01/23, 44 y.o.   MRN: [id number]  HPI  Here for wellness and f/u;  Overall doing ok;  Pt denies Chest pain, worsening SOB, DOE, wheezing, orthopnea, PND, worsening LE edema, palpitations, dizziness or syncope.  Pt denies neurological change such as new headache, facial or extremity weakness.  Pt denies polydipsia, polyuria, or low sugar symptoms. Pt states overall good compliance with treatment and medications, good tolerability, and has been trying to follow appropriate diet.  Pt denies worsening depressive symptoms, suicidal ideation or panic. No fever, night sweats, wt loss, loss of appetite, or other constitutional symptoms.  Pt states good ability with ADL's, has low fall risk, home safety reviewed and adequate, no other significant changes in hearing or vision, and only occasionally active with exercise. No current complaints except ongoing pain and swelling to all the DIPs of both hands. Does not seen rheum reguarly.  Out of meds x 1 wk. Past Medical History  Diagnosis Date  . NEURITIS, OPTIC NOS   . Essential hypertension, benign   . Rheumatoid arthritis(714.0)   . COMMON MIGRAINE    No past surgical history on file.  reports that she has never smoked. She has never used smokeless tobacco. She reports that she drinks alcohol. She reports that she does not use illicit drugs. family history includes Breast cancer in an other family member; Coronary artery disease in an other family member; Heart disease in her daughter; Hyperlipidemia in an other family member; Hypertension in an other family member; Lung cancer in an other family member; Sarcoidosis in her mother and another family member; Scleroderma in her father and another family member. Allergies  Allergen Reactions  . Dimetapp Decongestant [Pseudoephedrine]     Reaction unknown  . Pseudoephedrine-Dm     REACTION: not sure   Current Outpatient Prescriptions on File  Prior to Visit  Medication Sig Dispense Refill  . escitalopram (LEXAPRO) 10 MG tablet Take 10 mg by mouth daily.    Marland Kitchen levonorgestrel (MIRENA) 20 MCG/24HR IUD 1 each by Intrauterine route once.     No current facility-administered medications on file prior to visit.   Review of Systems Constitutional: Negative for increased diaphoresis, other activity, appetite or siginficant weight change other than noted HENT: Negative for worsening hearing loss, ear pain, facial swelling, mouth sores and neck stiffness.   Eyes: Negative for other worsening pain, redness or visual disturbance.  Respiratory: Negative for shortness of breath and wheezing  Cardiovascular: Negative for chest pain and palpitations.  Gastrointestinal: Negative for diarrhea, blood in stool, abdominal distention or other pain Genitourinary: Negative for hematuria, flank pain or change in urine volume.  Musculoskeletal: Negative for myalgias or other joint complaints. except for occas right knee pain Skin: Negative for color change and wound or drainage.  Neurological: Negative for syncope and numbness. other than noted Hematological: Negative for adenopathy. or other swelling Psychiatric/Behavioral: Negative for hallucinations, SI, self-injury, decreased concentration or other worsening agitation.      Objective:   Physical Exam BP 126/88 mmHg  Pulse 65  Temp(Src) 98.2 F (36.8 C) (Oral)  Resp 18  Ht 5' 3.5" (1.613 m)  Wt 167 lb 1.3 oz (75.787 kg)  BMI 29.13 kg/m2  SpO2 99% VS noted,  Constitutional: Pt is oriented to person, place, and time. Appears well-developed and well-nourished, in no significant distress Head: Normocephalic and atraumatic.  Right Ear: External ear normal.  Left Ear: External ear  normal.  Nose: Nose normal.  Mouth/Throat: Oropharynx is clear and moist.  Eyes: Conjunctivae and EOM are normal. Pupils are equal, round, and reactive to light.  Neck: Normal range of motion. Neck supple. No JVD  present. No tracheal deviation present or significant neck LA or mass Cardiovascular: Normal rate, regular rhythm, normal heart sounds and intact distal pulses.   Pulmonary/Chest: Effort normal and breath sounds without rales or wheezing  Abdominal: Soft. Bowel sounds are normal. NT. No HSM  Musculoskeletal: Normal range of motion. Exhibits no edema.  Lymphadenopathy:  Has no cervical adenopathy.  Neurological: Pt is alert and oriented to person, place, and time. Pt has normal reflexes. No cranial nerve deficit. Motor grossly intact Skin: Skin is warm and dry. No rash noted.  Psychiatric:  Has normal mood and affect. Behavior is normal.  All DIPs of both hands mild sweling/tender    Assessment & Plan:

## 2014-04-28 NOTE — Progress Notes (Signed)
Pre visit review using our clinic review tool, if applicable. No additional management support is needed unless otherwise documented below in the visit note. 

## 2014-04-28 NOTE — Assessment & Plan Note (Signed)
stable overall by history and exam, recent data reviewed with pt, and pt to continue medical treatment as before,  to f/u any worsening symptoms or concerns BP Readings from Last 3 Encounters:  04/28/14 126/88  06/08/13 144/90  11/13/12 132/98

## 2014-04-28 NOTE — Assessment & Plan Note (Signed)

## 2014-04-28 NOTE — Patient Instructions (Signed)
Please continue all other medications as before, and refills have been done if requested.  Please have the pharmacy call with any other refills you may need.  Please continue your efforts at being more active, low cholesterol diet, and weight control.  You are otherwise up to date with prevention measures today.  Please keep your appointments with your specialists as you may have planned  You will be contacted regarding the referral for: rheumatology  Please go to the LAB in the Basement (turn left off the elevator) for the tests to be done tomorrow  You will be contacted by phone if any changes need to be made immediately.  Otherwise, you will receive a letter about your results with an explanation, but please check with MyChart first.  Please remember to sign up for MyChart if you have not done so, as this will be important to you in the future with finding out test results, communicating by private email, and scheduling acute appointments online when needed.  Please return in 1 year for your yearly visit, or sooner if needed

## 2014-04-28 NOTE — Assessment & Plan Note (Signed)
For esr, refer rheum

## 2014-04-29 ENCOUNTER — Other Ambulatory Visit (INDEPENDENT_AMBULATORY_CARE_PROVIDER_SITE_OTHER): Payer: BC Managed Care – PPO

## 2014-04-29 DIAGNOSIS — Z0000 Encounter for general adult medical examination without abnormal findings: Secondary | ICD-10-CM

## 2014-04-29 DIAGNOSIS — M069 Rheumatoid arthritis, unspecified: Secondary | ICD-10-CM

## 2014-04-29 LAB — CBC WITH DIFFERENTIAL/PLATELET
BASOS PCT: 0.4 % (ref 0.0–3.0)
Basophils Absolute: 0 10*3/uL (ref 0.0–0.1)
Eosinophils Absolute: 0.1 10*3/uL (ref 0.0–0.7)
Eosinophils Relative: 3 % (ref 0.0–5.0)
HCT: 40.1 % (ref 36.0–46.0)
Hemoglobin: 13.4 g/dL (ref 12.0–15.0)
LYMPHS PCT: 27.8 % (ref 12.0–46.0)
Lymphs Abs: 1.3 10*3/uL (ref 0.7–4.0)
MCHC: 33.3 g/dL (ref 30.0–36.0)
MCV: 85.7 fl (ref 78.0–100.0)
MONOS PCT: 11.3 % (ref 3.0–12.0)
Monocytes Absolute: 0.5 10*3/uL (ref 0.1–1.0)
NEUTROS ABS: 2.8 10*3/uL (ref 1.4–7.7)
Neutrophils Relative %: 57.5 % (ref 43.0–77.0)
Platelets: 218 10*3/uL (ref 150.0–400.0)
RBC: 4.69 Mil/uL (ref 3.87–5.11)
RDW: 12.6 % (ref 11.5–15.5)
WBC: 4.8 10*3/uL (ref 4.0–10.5)

## 2014-04-29 LAB — HEPATIC FUNCTION PANEL
ALBUMIN: 3.7 g/dL (ref 3.5–5.2)
ALT: 9 U/L (ref 0–35)
AST: 14 U/L (ref 0–37)
Alkaline Phosphatase: 63 U/L (ref 39–117)
BILIRUBIN TOTAL: 0.3 mg/dL (ref 0.2–1.2)
Bilirubin, Direct: 0.1 mg/dL (ref 0.0–0.3)
Total Protein: 6.5 g/dL (ref 6.0–8.3)

## 2014-04-29 LAB — URINALYSIS, ROUTINE W REFLEX MICROSCOPIC
BILIRUBIN URINE: NEGATIVE
NITRITE: NEGATIVE
Specific Gravity, Urine: 1.025 (ref 1.000–1.030)
Urine Glucose: NEGATIVE
Urobilinogen, UA: 0.2 (ref 0.0–1.0)
pH: 6.5 (ref 5.0–8.0)

## 2014-04-29 LAB — LIPID PANEL
Cholesterol: 184 mg/dL (ref 0–200)
HDL: 46.4 mg/dL (ref >39.00)
LDL CALC: 100 mg/dL — AB (ref 0–99)
NonHDL: 137.6
TRIGLYCERIDES: 187 mg/dL — AB (ref 0.0–149.0)
Total CHOL/HDL Ratio: 4
VLDL: 37.4 mg/dL (ref 0.0–40.0)

## 2014-04-29 LAB — BASIC METABOLIC PANEL
BUN: 11 mg/dL (ref 6–23)
CO2: 30 meq/L (ref 19–32)
CREATININE: 0.77 mg/dL (ref 0.40–1.20)
Calcium: 9 mg/dL (ref 8.4–10.5)
Chloride: 104 mEq/L (ref 96–112)
GFR: 105.96 mL/min (ref >60.00)
GLUCOSE: 72 mg/dL (ref 70–99)
Potassium: 3.8 mEq/L (ref 3.5–5.1)
Sodium: 137 mEq/L (ref 135–145)

## 2014-04-29 LAB — TSH: TSH: 1.95 u[IU]/mL (ref 0.35–4.50)

## 2014-04-29 LAB — SEDIMENTATION RATE: Sed Rate: 9 mm/hr (ref 0–22)

## 2014-04-30 ENCOUNTER — Encounter: Payer: Self-pay | Admitting: Internal Medicine

## 2015-06-16 ENCOUNTER — Other Ambulatory Visit: Payer: Self-pay | Admitting: Internal Medicine

## 2016-07-17 ENCOUNTER — Encounter: Payer: Self-pay | Admitting: Internal Medicine

## 2016-07-17 ENCOUNTER — Ambulatory Visit (INDEPENDENT_AMBULATORY_CARE_PROVIDER_SITE_OTHER): Payer: BC Managed Care – PPO | Admitting: Internal Medicine

## 2016-07-17 VITALS — BP 162/90 | HR 81 | Ht 63.5 in | Wt 176.0 lb

## 2016-07-17 DIAGNOSIS — J019 Acute sinusitis, unspecified: Secondary | ICD-10-CM | POA: Diagnosis not present

## 2016-07-17 DIAGNOSIS — I10 Essential (primary) hypertension: Secondary | ICD-10-CM

## 2016-07-17 MED ORDER — AZITHROMYCIN 250 MG PO TABS: ORAL_TABLET | ORAL | 1 refills | Status: DC

## 2016-07-17 NOTE — Patient Instructions (Signed)
Please take all new medication as prescribed - the antibiotic  Please continue all other medications as before, and refills have been done if requested.  Please have the pharmacy call with any other refills you may need.  Please keep your appointments with your specialists as you may have planned   

## 2016-07-17 NOTE — Assessment & Plan Note (Signed)
Mild elevation, BP usually better, likely reactive, ok to follow on same tx

## 2016-07-17 NOTE — Assessment & Plan Note (Signed)
Mild to mod, for antibx course,  to f/u any worsening symptoms or concerns 

## 2016-07-17 NOTE — Progress Notes (Signed)
   Subjective:    Patient ID: MACLOVIA UHER, female    DOB: 07-20-1970, 46 y.o.   MRN: [id number]  HPI   Here with 2-3 days acute onset fever, facial pain, pressure, headache, general weakness and malaise, and greenish d/c, with mild ST and cough, but pt denies chest pain, wheezing, increased sob or doe, orthopnea, PND, increased LE swelling, palpitations, dizziness or syncope.  Pt denies new neurological symptoms such as new headache, or facial or extremity weakness or numbness   Pt denies polydipsia, polyuria, Works as Animal nutritionist for [employer] who have also been ill recently BP has been < 140/90 recently, today is unusual Past Medical History:  Diagnosis Date  . COMMON MIGRAINE   . Essential hypertension, benign   . NEURITIS, OPTIC NOS   . Rheumatoid arthritis(714.0)    No past surgical history on file.  reports that she has never smoked. She has never used smokeless tobacco. She reports that she drinks alcohol. She reports that she does not use drugs. family history includes Heart disease in her daughter; Sarcoidosis in her mother; Scleroderma in her father. Allergies  Allergen Reactions  . Dimetapp Decongestant [Pseudoephedrine]     Reaction unknown  . Pseudoephedrine-Dm     REACTION: not sure   Current Outpatient Prescriptions on File Prior to Visit  Medication Sig Dispense Refill  . escitalopram (LEXAPRO) 10 MG tablet Take 10 mg by mouth daily.    . hydrochlorothiazide (HYDRODIURIL) 25 MG tablet TAKE 1 TABLET (25 MG TOTAL) BY MOUTH DAILY. 90 tablet 3  . levonorgestrel (MIRENA) 20 MCG/24HR IUD 1 each by Intrauterine route once.     No current facility-administered medications on file prior to visit.    Review of Systems All otherwise neg per pt    Objective:   Physical Exam BP (!) 162/90   Pulse 81   Ht 5' 3.5" (1.613 m)   Wt 176 lb (79.8 kg)   SpO2 100%   BMI 30.69 kg/m  VS noted, mild ill appaering Constitutional: Pt appears in NAD HENT: Head: NCAT.    Right Ear: External ear normal.  Left Ear: External ear normal.  Eyes: . Pupils are equal, round, and reactive to light. Conjunctivae and EOM are normal Bilat tm's with mild erythema.  Max sinus areas mild tender.  Pharynx with mild erythema, no exudate  Nose: without d/c or deformity Neck: Neck supple. Gross normal ROM Cardiovascular: Normal rate and regular rhythm.   Pulmonary/Chest: Effort normal and breath sounds without rales or wheezing.  Neurological: Pt is alert. At baseline orientation, motor grossly intact Skin: Skin is warm. No rashes, other new lesions, no LE edema Psychiatric: Pt behavior is normal without agitation , mild nervous        Assessment & Plan:

## 2016-08-10 ENCOUNTER — Other Ambulatory Visit: Payer: Self-pay | Admitting: Internal Medicine

## 2016-09-29 ENCOUNTER — Other Ambulatory Visit: Payer: Self-pay | Admitting: Internal Medicine

## 2017-01-31 DIAGNOSIS — L249 Irritant contact dermatitis, unspecified cause: Secondary | ICD-10-CM | POA: Insufficient documentation

## 2017-01-31 DIAGNOSIS — L219 Seborrheic dermatitis, unspecified: Secondary | ICD-10-CM | POA: Insufficient documentation

## 2017-01-31 DIAGNOSIS — L271 Localized skin eruption due to drugs and medicaments taken internally: Secondary | ICD-10-CM | POA: Insufficient documentation

## 2017-03-21 ENCOUNTER — Encounter: Payer: Self-pay | Admitting: Family Medicine

## 2017-03-21 ENCOUNTER — Ambulatory Visit (INDEPENDENT_AMBULATORY_CARE_PROVIDER_SITE_OTHER): Payer: BC Managed Care – PPO | Admitting: Family Medicine

## 2017-03-21 DIAGNOSIS — I10 Essential (primary) hypertension: Secondary | ICD-10-CM | POA: Diagnosis not present

## 2017-03-21 MED ORDER — LOSARTAN POTASSIUM 50 MG PO TABS: 50.0000 mg | ORAL_TABLET | Freq: Every day | ORAL | 0 refills | Status: DC

## 2017-03-21 NOTE — Patient Instructions (Signed)
Please to add exercise at least 30 minutes a day for most days of the week. Please continue try to check her blood pressure and let us know if it continues to be elevated. We may need to increase your medication or add to it. Please stop hydrochlorothiazide.

## 2017-03-21 NOTE — Progress Notes (Signed)
Debbie Graves - 47 y.o. female MRN [id number]  Date of birth: January 22, 1971  SUBJECTIVE:  Including CC & ROS.  Chief Complaint  Patient presents with  . Hypertension    Debbie Graves is a 47 y.o. female that is presenting with elevated blood pressure. She has been having elevated readings ongoing for two months. Readings have been 140/90s. No change in her blood pressure medication. She has been taking hydrochlorothiazide daily. Denies dizziness. Admits to headaches. Denies any change in her history or medications.     Review of Systems  Constitutional: Negative for fever.  Respiratory: Negative for cough.   Cardiovascular: Negative for chest pain and leg swelling.  Gastrointestinal: Negative for abdominal pain.  Musculoskeletal: Negative for back pain.  Skin: Negative for color change.    HISTORY: Past Medical, Surgical, Social, and Family History Reviewed & Updated per EMR.   Pertinent Historical Findings include:  Past Medical History:  Diagnosis Date  . COMMON MIGRAINE   . Essential hypertension, benign   . NEURITIS, OPTIC NOS   . Rheumatoid arthritis(714.0)     No past surgical history on file.  Allergies  Allergen Reactions  . Dimetapp Decongestant [Pseudoephedrine]     Reaction unknown  . Pseudoephedrine-Dm     REACTION: not sure    Family History  Problem Relation Age of Onset  . Sarcoidosis Mother   . Scleroderma Father   . Heart disease Daughter   . Coronary artery disease Unknown   . Hyperlipidemia Unknown   . Hypertension Unknown   . Lung cancer Unknown   . Breast cancer Unknown   . Scleroderma Unknown   . Sarcoidosis Unknown      Social History   Socioeconomic History  . Marital status: Married    Spouse name: Not on file  . Number of children: Not on file  . Years of education: Not on file  . Highest education level: Not on file  Social Needs  . Financial resource strain: Not on file  . Food insecurity - worry: Not on file  . Food  insecurity - inability: Not on file  . Transportation needs - medical: Not on file  . Transportation needs - non-medical: Not on file  Occupational History  . Not on file  Tobacco Use  . Smoking status: Never Smoker  . Smokeless tobacco: Never Used  Substance and Sexual Activity  . Alcohol use: Yes    Comment: Occasional  . Drug use: No  . Sexual activity: Not on file  Other Topics Concern  . Not on file  Social History Narrative  . Not on file     PHYSICAL EXAM:  VS: BP (!) 156/88 (BP Location: Left Arm, Patient Position: Sitting, Cuff Size: Normal)   Pulse 82   Temp 97.6 F (36.4 C) (Oral)   Ht 5' 3.5" (1.613 m)   Wt 187 lb (84.8 kg)   SpO2 97%   BMI 32.61 kg/m  Physical Exam Gen: NAD, alert, cooperative with exam, well-appearing ENT: normal lips, normal nasal mucosa,  Eye: normal EOM, normal conjunctiva and lids CV:  no edema, +2 pedal pulses, S1-S2, regular rate and rhythm Resp: no accessory muscle use, non-labored, clear auscultation bilaterally Skin: no rashes, no areas of induration  Neuro: normal tone, normal sensation to touch Psych:  normal insight, alert and oriented MSK: Normal gait, normal strength     ASSESSMENT & PLAN:   Essential hypertension, benign Currently uncontrolled. - Stopped the hydrochlorothiazide and start losartan. Counseled that  she can add back hydrochlorothiazide if her blood pressure remains uncontrolled or we could increase the losartan. - Given indications follow up

## 2017-03-22 NOTE — Assessment & Plan Note (Addendum)
Currently uncontrolled. - Stopped the hydrochlorothiazide and start losartan. Counseled that she can add back hydrochlorothiazide if her blood pressure remains uncontrolled or we could increase the losartan. - Given indications follow up

## 2017-05-20 ENCOUNTER — Ambulatory Visit (INDEPENDENT_AMBULATORY_CARE_PROVIDER_SITE_OTHER): Payer: BC Managed Care – PPO | Admitting: Internal Medicine

## 2017-05-20 ENCOUNTER — Encounter: Payer: Self-pay | Admitting: Internal Medicine

## 2017-05-20 ENCOUNTER — Other Ambulatory Visit (INDEPENDENT_AMBULATORY_CARE_PROVIDER_SITE_OTHER): Payer: BC Managed Care – PPO

## 2017-05-20 VITALS — BP 138/90 | HR 76 | Temp 98.4°F | Ht 63.5 in | Wt 182.0 lb

## 2017-05-20 DIAGNOSIS — Z114 Encounter for screening for human immunodeficiency virus [HIV]: Secondary | ICD-10-CM

## 2017-05-20 DIAGNOSIS — I10 Essential (primary) hypertension: Secondary | ICD-10-CM

## 2017-05-20 DIAGNOSIS — Z0000 Encounter for general adult medical examination without abnormal findings: Secondary | ICD-10-CM

## 2017-05-20 LAB — BASIC METABOLIC PANEL
BUN: 10 mg/dL (ref 6–23)
CALCIUM: 9 mg/dL (ref 8.4–10.5)
CO2: 27 mEq/L (ref 19–32)
Chloride: 104 mEq/L (ref 96–112)
Creatinine, Ser: 0.79 mg/dL (ref 0.40–1.20)
GFR: 101.4 mL/min (ref >60.00)
Glucose, Bld: 82 mg/dL (ref 70–99)
Potassium: 3.9 mEq/L (ref 3.5–5.1)
SODIUM: 140 meq/L (ref 135–145)

## 2017-05-20 LAB — CBC WITH DIFFERENTIAL/PLATELET
BASOS PCT: 1.2 % (ref 0.0–3.0)
Basophils Absolute: 0 10*3/uL (ref 0.0–0.1)
EOS PCT: 4.1 % (ref 0.0–5.0)
Eosinophils Absolute: 0.2 10*3/uL (ref 0.0–0.7)
HEMATOCRIT: 42.5 % (ref 36.0–46.0)
HEMOGLOBIN: 14.1 g/dL (ref 12.0–15.0)
LYMPHS PCT: 31.5 % (ref 12.0–46.0)
Lymphs Abs: 1.3 10*3/uL (ref 0.7–4.0)
MCHC: 33.1 g/dL (ref 30.0–36.0)
MCV: 88.1 fl (ref 78.0–100.0)
MONOS PCT: 11.3 % (ref 3.0–12.0)
Monocytes Absolute: 0.5 10*3/uL (ref 0.1–1.0)
NEUTROS ABS: 2.2 10*3/uL (ref 1.4–7.7)
Neutrophils Relative %: 51.9 % (ref 43.0–77.0)
PLATELETS: 264 10*3/uL (ref 150.0–400.0)
RBC: 4.83 Mil/uL (ref 3.87–5.11)
RDW: 12.5 % (ref 11.5–15.5)
WBC: 4.3 10*3/uL (ref 4.0–10.5)

## 2017-05-20 LAB — HEPATIC FUNCTION PANEL
ALBUMIN: 3.9 g/dL (ref 3.5–5.2)
ALT: 9 U/L (ref 0–35)
AST: 14 U/L (ref 0–37)
Alkaline Phosphatase: 90 U/L (ref 39–117)
BILIRUBIN DIRECT: 0.1 mg/dL (ref 0.0–0.3)
Total Bilirubin: 0.4 mg/dL (ref 0.2–1.2)
Total Protein: 6.7 g/dL (ref 6.0–8.3)

## 2017-05-20 LAB — URINALYSIS, ROUTINE W REFLEX MICROSCOPIC
Bilirubin Urine: NEGATIVE
Hgb urine dipstick: NEGATIVE
Ketones, ur: NEGATIVE
Leukocytes, UA: NEGATIVE
Nitrite: NEGATIVE
PH: 6.5 (ref 5.0–8.0)
RBC / HPF: NONE SEEN (ref 0–?)
SPECIFIC GRAVITY, URINE: 1.02 (ref 1.000–1.030)
TOTAL PROTEIN, URINE-UPE24: NEGATIVE
UROBILINOGEN UA: 0.2 (ref 0.0–1.0)
Urine Glucose: NEGATIVE

## 2017-05-20 LAB — LIPID PANEL
CHOLESTEROL: 212 mg/dL — AB (ref 0–200)
HDL: 55.3 mg/dL (ref >39.00)
LDL Cholesterol: 125 mg/dL — ABNORMAL HIGH (ref 0–99)
NonHDL: 156.31
TRIGLYCERIDES: 159 mg/dL — AB (ref 0.0–149.0)
Total CHOL/HDL Ratio: 4
VLDL: 31.8 mg/dL (ref 0.0–40.0)

## 2017-05-20 LAB — TSH: TSH: 2.37 u[IU]/mL (ref 0.35–4.50)

## 2017-05-20 MED ORDER — LOSARTAN POTASSIUM 100 MG PO TABS: 100.0000 mg | ORAL_TABLET | Freq: Every day | ORAL | 3 refills | Status: DC

## 2017-05-20 NOTE — Assessment & Plan Note (Addendum)
Borderline control, for increased losartan 100 qd, tolerating well

## 2017-05-20 NOTE — Progress Notes (Signed)
Subjective:    Patient ID: Debbie Graves, female    DOB: 1971/01/09, 47 y.o.   MRN: [id number]  HPI  Here for wellness and f/u;  Overall doing ok;  Pt denies Chest pain, worsening SOB, DOE, wheezing, orthopnea, PND, worsening LE edema, palpitations, dizziness or syncope.  Pt denies neurological change such as new headache, facial or extremity weakness.  Pt denies polydipsia, polyuria, or low sugar symptoms. Pt states overall good compliance with treatment and medications, good tolerability, and has been trying to follow appropriate diet.  Pt denies worsening depressive symptoms, suicidal ideation or panic. No fever, night sweats, wt loss, loss of appetite, or other constitutional symptoms.  Pt states good ability with ADL's, has low fall risk, home safety reviewed and adequate, no other significant changes in hearing or vision, and only occasionally active with exercise. Sees rheum on regular basis, doing well, has some rash, taken off plaquenil, saw derm with all rash resovled  No other interval hx or new compalints Past Medical History:  Diagnosis Date  . COMMON MIGRAINE   . Essential hypertension, benign   . NEURITIS, OPTIC NOS   . Rheumatoid arthritis(714.0)    No past surgical history on file.  reports that she has never smoked. She has never used smokeless tobacco. She reports that she drinks alcohol. She reports that she does not use drugs. family history includes Breast cancer in her unknown relative; Coronary artery disease in her unknown relative; Heart disease in her daughter; Hyperlipidemia in her unknown relative; Hypertension in her unknown relative; Lung cancer in her unknown relative; Sarcoidosis in her mother and unknown relative; Scleroderma in her father and unknown relative. Allergies  Allergen Reactions  . Dimetapp Decongestant [Pseudoephedrine]     Reaction unknown  . Pseudoephedrine-Dm     REACTION: not sure   Current Outpatient Medications on File Prior to Visit    Medication Sig Dispense Refill  . escitalopram (LEXAPRO) 10 MG tablet Take 10 mg by mouth daily.    Marland Kitchen etanercept (ENBREL) 50 MG/ML injection Inject 50 mg into the skin once a week.    Marland Kitchen levonorgestrel (MIRENA) 20 MCG/24HR IUD 1 each by Intrauterine route once.     No current facility-administered medications on file prior to visit.    Review of Systems Constitutional: Negative for other unusual diaphoresis, sweats, appetite or weight changes HENT: Negative for other worsening hearing loss, ear pain, facial swelling, mouth sores or neck stiffness.   Eyes: Negative for other worsening pain, redness or other visual disturbance.  Respiratory: Negative for other stridor or swelling Cardiovascular: Negative for other palpitations or other chest pain  Gastrointestinal: Negative for worsening diarrhea or loose stools, blood in stool, distention or other pain Genitourinary: Negative for hematuria, flank pain or other change in urine volume.  Musculoskeletal: Negative for myalgias or other joint swelling.  Skin: Negative for other color change, or other wound or worsening drainage.  Neurological: Negative for other syncope or numbness. Hematological: Negative for other adenopathy or swelling Psychiatric/Behavioral: Negative for hallucinations, other worsening agitation, SI, self-injury, or new decreased concentration All other system neg per pt    Objective:   Physical Exam BP 138/90   Pulse 76   Temp 98.4 F (36.9 C) (Oral)   Ht 5' 3.5" (1.613 m)   Wt 182 lb (82.6 kg)   SpO2 97%   BMI 31.73 kg/m  VS noted,  Constitutional: Pt is oriented to person, place, and time. Appears well-developed and well-nourished, in no significant  distress and comfortable Head: Normocephalic and atraumatic  Eyes: Conjunctivae and EOM are normal. Pupils are equal, round, and reactive to light Right Ear: External ear normal without discharge Left Ear: External ear normal without discharge Nose: Nose without  discharge or deformity Mouth/Throat: Oropharynx is without other ulcerations and moist  Neck: Normal range of motion. Neck supple. No JVD present. No tracheal deviation present or significant neck LA or mass Cardiovascular: Normal rate, regular rhythm, normal heart sounds and intact distal pulses.   Pulmonary/Chest: WOB normal and breath sounds without rales or wheezing  Abdominal: Soft. Bowel sounds are normal. NT. No HSM  Musculoskeletal: Normal range of motion. Exhibits no edema Lymphadenopathy: Has no other cervical adenopathy.  Neurological: Pt is alert and oriented to person, place, and time. Pt has normal reflexes. No cranial nerve deficit. Motor grossly intact, Gait intact Skin: Skin is warm and dry. No rash noted or new ulcerations Psychiatric:  Has normal mood and affect. Behavior is normal without agitation No other exam findings Lab Results  Component Value Date   WBC 4.8 04/29/2014   HGB 13.4 04/29/2014   HCT 40.1 04/29/2014   PLT 218.0 04/29/2014   GLUCOSE 72 04/29/2014   CHOL 184 04/29/2014   TRIG 187.0 (H) 04/29/2014   HDL 46.40 04/29/2014   LDLCALC 100 (H) 04/29/2014   ALT 9 04/29/2014   AST 14 04/29/2014   NA 137 04/29/2014   K 3.8 04/29/2014   CL 104 04/29/2014   CREATININE 0.77 04/29/2014   BUN 11 04/29/2014   CO2 30 04/29/2014   TSH 1.95 04/29/2014      Assessment & Plan:

## 2017-05-20 NOTE — Assessment & Plan Note (Signed)

## 2017-05-20 NOTE — Patient Instructions (Addendum)
OK to increase the losartan to 100 mg per day  Please continue all other medications as before, and refills have been done if requested.  Please have the pharmacy call with any other refills you may need.  Please continue your efforts at being more active, low cholesterol diet, and weight control.  You are otherwise up to date with prevention measures today.  Please keep your appointments with your specialists as you may have planned  Please go to the LAB in the Basement (turn left off the elevator) for the tests to be done today  You will be contacted by phone if any changes need to be made immediately.  Otherwise, you will receive a letter about your results with an explanation, but please check with MyChart first.  Please remember to sign up for MyChart if you have not done so, as this will be important to you in the future with finding out test results, communicating by private email, and scheduling acute appointments online when needed.  Please return in 1 year for your yearly visit, or sooner if needed, with Lab testing done 3-5 days before

## 2017-05-21 LAB — HIV ANTIBODY (ROUTINE TESTING W REFLEX): HIV 1&2 Ab, 4th Generation: NONREACTIVE

## 2017-08-05 ENCOUNTER — Ambulatory Visit (INDEPENDENT_AMBULATORY_CARE_PROVIDER_SITE_OTHER): Payer: BC Managed Care – PPO | Admitting: Internal Medicine

## 2017-08-05 ENCOUNTER — Encounter: Payer: Self-pay | Admitting: Internal Medicine

## 2017-08-05 VITALS — BP 138/88 | HR 70 | Temp 98.5°F | Ht 63.5 in | Wt 187.0 lb

## 2017-08-05 DIAGNOSIS — R062 Wheezing: Secondary | ICD-10-CM

## 2017-08-05 DIAGNOSIS — I10 Essential (primary) hypertension: Secondary | ICD-10-CM | POA: Diagnosis not present

## 2017-08-05 DIAGNOSIS — R059 Cough, unspecified: Secondary | ICD-10-CM

## 2017-08-05 DIAGNOSIS — R05 Cough: Secondary | ICD-10-CM | POA: Diagnosis not present

## 2017-08-05 DIAGNOSIS — J309 Allergic rhinitis, unspecified: Secondary | ICD-10-CM

## 2017-08-05 MED ORDER — PREDNISONE 10 MG PO TABS: ORAL_TABLET | ORAL | 0 refills | Status: DC

## 2017-08-05 MED ORDER — ALBUTEROL SULFATE HFA 108 (90 BASE) MCG/ACT IN AERS: 2.0000 | INHALATION_SPRAY | Freq: Four times a day (QID) | RESPIRATORY_TRACT | 1 refills | Status: DC | PRN

## 2017-08-05 MED ORDER — HYDROCODONE-HOMATROPINE 5-1.5 MG/5ML PO SYRP: 5.0000 mL | ORAL_SOLUTION | Freq: Four times a day (QID) | ORAL | 0 refills | Status: AC | PRN

## 2017-08-05 MED ORDER — AZITHROMYCIN 250 MG PO TABS: ORAL_TABLET | ORAL | 1 refills | Status: DC

## 2017-08-05 MED ORDER — METHYLPREDNISOLONE ACETATE 80 MG/ML IJ SUSP
80.0000 mg | Freq: Once | INTRAMUSCULAR | Status: AC
  Administered 2017-08-05: 80 mg via INTRAMUSCULAR

## 2017-08-05 NOTE — Assessment & Plan Note (Signed)
Also to improve with steroid tx, then otc allegra prn

## 2017-08-05 NOTE — Assessment & Plan Note (Signed)
Mild to mod, c/w bronchitis vs pna, declines cxr, for antibx course, cough med prn,  to f/u any worsening symptoms or concerns 

## 2017-08-05 NOTE — Patient Instructions (Signed)
You had the steroid shot today  Please take all new medication as prescribed - the antibiotic, cough medicine, prednisone and inhaler if needed  Please continue all other medications as before, and refills have been done if requested.  Please have the pharmacy call with any other refills you may need.  Please keep your appointments with your specialists as you may have planned

## 2017-08-05 NOTE — Assessment & Plan Note (Signed)
Mild to mod, unusual for her, likely related to current infectious process, for predpac asd, albuterol HFA prn,  to f/u any worsening symptoms or concerns

## 2017-08-05 NOTE — Progress Notes (Signed)
Subjective:    Patient ID: Debbie Graves, female    DOB: 1970-03-26, 47 y.o.   MRN: [id number]  HPI  Here with acute onset mild to mod 5 days ST, HA, general weakness and malaise, with prod cough greenish sputum, but Pt denies chest pain, increased sob or doe, wheezing, orthopnea, PND, increased LE swelling, palpitations, dizziness or syncope, except for onset mild wheezing/ sob doe since 1 days.  Seemed to start after return from [place] vacation to [place].  No sick contacts.  Husband urged her to come today.  Does have several wks since may 2019 ongoing nasal allergy symptoms with clearish congestion, itch and sneezing, without fever, pain, ST, cough, swelling  Past Medical History:  Diagnosis Date  . COMMON MIGRAINE   . Essential hypertension, benign   . NEURITIS, OPTIC NOS   . Rheumatoid arthritis(714.0)    No past surgical history on file.  reports that she has never smoked. She has never used smokeless tobacco. She reports that she drinks alcohol. She reports that she does not use drugs. family history includes Breast cancer in her unknown relative; Coronary artery disease in her unknown relative; Heart disease in her daughter; Hyperlipidemia in her unknown relative; Hypertension in her unknown relative; Lung cancer in her unknown relative; Sarcoidosis in her mother and unknown relative; Scleroderma in her father and unknown relative. Allergies  Allergen Reactions  . Dimetapp Decongestant [Pseudoephedrine]     Reaction unknown  . Pseudoephedrine-Dm     REACTION: not sure   Current Outpatient Medications on File Prior to Visit  Medication Sig Dispense Refill  . escitalopram (LEXAPRO) 10 MG tablet Take 10 mg by mouth daily.    Marland Kitchen etanercept (ENBREL) 50 MG/ML injection Inject 50 mg into the skin once a week.    Marland Kitchen levonorgestrel (MIRENA) 20 MCG/24HR IUD 1 each by Intrauterine route once.    Marland Kitchen losartan (COZAAR) 100 MG tablet Take 1 tablet (100 mg total) by mouth daily. 90 tablet 3     No current facility-administered medications on file prior to visit.    Review of Systems   Constitutional: Negative for other unusual diaphoresis or sweats HENT: Negative for ear discharge or swelling Eyes: Negative for other worsening visual disturbances Respiratory: Negative for stridor or other swelling  Gastrointestinal: Negative for worsening distension or other blood Genitourinary: Negative for retention or other urinary change Musculoskeletal: Negative for other MSK pain or swelling Skin: Negative for color change or other new lesions Neurological: Negative for worsening tremors and other numbness  Psychiatric/Behavioral: Negative for worsening agitation or other fatigue All other system neg per pt    Objective:   Physical Exam BP 138/88   Pulse 70   Temp 98.5 F (36.9 C) (Oral)   Ht 5' 3.5" (1.613 m)   Wt 187 lb (84.8 kg)   SpO2 97%   BMI 32.61 kg/m  VS noted, mild ill Constitutional: Pt appears in NAD HENT: Head: NCAT.  Right Ear: External ear normal.  Left Ear: External ear normal Bilat tm's with mild erythema.  Max sinus areas non tender.  Pharynx with mild erythema, no exudate .  Eyes: . Pupils are equal, round, and reactive to light. Conjunctivae and EOM are normal Nose: without d/c or deformity Neck: Neck supple. Gross normal ROM Cardiovascular: Normal rate and regular rhythm.   Pulmonary/Chest: Effort normal and breath sounds decreased without rales but with few bilat wheezing.  Neurological: Pt is alert. At baseline orientation, motor grossly intact Skin: Skin  is warm. No rashes, other new lesions, no LE edema Psychiatric: Pt behavior is normal without agitation  No other exam findings Lab Results  Component Value Date   WBC 4.3 05/20/2017   HGB 14.1 05/20/2017   HCT 42.5 05/20/2017   PLT 264.0 05/20/2017   GLUCOSE 82 05/20/2017   CHOL 212 (H) 05/20/2017   TRIG 159.0 (H) 05/20/2017   HDL 55.30 05/20/2017   LDLCALC 125 (H) 05/20/2017   ALT 9  05/20/2017   AST 14 05/20/2017   NA 140 05/20/2017   K 3.9 05/20/2017   CL 104 05/20/2017   CREATININE 0.79 05/20/2017   BUN 10 05/20/2017   CO2 27 05/20/2017   TSH 2.37 05/20/2017       Assessment & Plan:

## 2017-08-05 NOTE — Assessment & Plan Note (Signed)
stable overall by history and exam, recent data reviewed with pt, and pt to continue medical treatment as before,  to f/u any worsening symptoms or concerns BP Readings from Last 3 Encounters:  08/05/17 138/88  05/20/17 138/90  03/21/17 (!) 156/88

## 2017-08-23 ENCOUNTER — Encounter: Payer: Self-pay | Admitting: Family

## 2017-08-23 ENCOUNTER — Ambulatory Visit (INDEPENDENT_AMBULATORY_CARE_PROVIDER_SITE_OTHER): Payer: BC Managed Care – PPO | Admitting: Family

## 2017-08-23 VITALS — BP 128/88 | HR 87 | Temp 98.2°F | Ht 63.5 in | Wt 179.1 lb

## 2017-08-23 DIAGNOSIS — K529 Noninfective gastroenteritis and colitis, unspecified: Secondary | ICD-10-CM

## 2017-08-23 DIAGNOSIS — J9801 Acute bronchospasm: Secondary | ICD-10-CM

## 2017-08-23 MED ORDER — ONDANSETRON HCL 4 MG PO TABS: 4.0000 mg | ORAL_TABLET | Freq: Three times a day (TID) | ORAL | 0 refills | Status: DC | PRN

## 2017-08-23 NOTE — Progress Notes (Signed)
Debbie Graves is a 47 y.o. female with the following history as recorded in [employer]:  Patient Active Problem List   Diagnosis Date Noted  . Cough 08/05/2017  . Wheezing 08/05/2017  . Allergic rhinitis 08/05/2017  . Other malaise and fatigue 11/05/2012  . Paresthesia of right upper limb 11/05/2012  . Rheumatoid arthritis ([place]) 04/18/2012  . Anxiety 06/07/2011  . Preventative health care 06/06/2011  . Essential hypertension, benign 12/19/2006  . COMMON MIGRAINE 11/18/2006  . NEURITIS, OPTIC NOS 11/18/2006    Current Outpatient Medications  Medication Sig Dispense Refill  . albuterol (PROVENTIL HFA;VENTOLIN HFA) 108 (90 Base) MCG/ACT inhaler Inhale 2 puffs into the lungs every 6 (six) hours as needed for wheezing or shortness of breath. 1 Inhaler 1  . escitalopram (LEXAPRO) 10 MG tablet Take 10 mg by mouth daily.    Marland Kitchen etanercept (ENBREL) 50 MG/ML injection Inject 50 mg into the skin once a week.    . Fluocinolone Acetonide Scalp 0.01 % OIL APPLY TO SCALP DAILY FOR 2 WK, THEN EVERY OTHER DAY FOR 2 WKS, THEN DAILY AS NEEDED.  5  . levonorgestrel (MIRENA) 20 MCG/24HR IUD 1 each by Intrauterine route once.    Marland Kitchen losartan (COZAAR) 100 MG tablet Take 1 tablet (100 mg total) by mouth daily. 90 tablet 3  . ondansetron (ZOFRAN) 4 MG tablet Take 1 tablet (4 mg total) by mouth every 8 (eight) hours as needed for nausea or vomiting. 20 tablet 0   No current facility-administered medications for this visit.     Allergies: Dimetapp decongestant [pseudoephedrine] and Pseudoephedrine-dm  Past Medical History:  Diagnosis Date  . COMMON MIGRAINE   . Essential hypertension, benign   . NEURITIS, OPTIC NOS   . Rheumatoid arthritis(714.0)     History reviewed. No pertinent surgical history.  Family History  Problem Relation Age of Onset  . Sarcoidosis Mother   . Scleroderma Father   . Heart disease Daughter   . Coronary artery disease Unknown   . Hyperlipidemia Unknown   . Hypertension  Unknown   . Lung cancer Unknown   . Breast cancer Unknown   . Scleroderma Unknown   . Sarcoidosis Unknown     Social History   Tobacco Use  . Smoking status: Never Smoker  . Smokeless tobacco: Never Used  Substance Use Topics  . Alcohol use: Yes    Comment: Occasional    Subjective:  1 day history of nausea/ vomiting/ diarrhea; tried Ginger-Ale with no benefit; has been able to keep grapes down today; has not had any diarrhea today; no abdominal pain;   2 week follow-up on bronchitis; was treated with Z-pak, Prednisone x 10 days, Hycodan; does feel some improvement- "still having coughing fits/ "barking cough." Still occasionally productive cough; no fever, no chest pain;   Objective:  Vitals:   08/23/17 1420  BP: 128/88  Pulse: 87  Temp: 98.2 F (36.8 C)  TempSrc: Oral  SpO2: 97%  Weight: 179 lb 1.9 oz (81.2 kg)  Height: 5' 3.5" (1.613 m)    General: Well developed, well nourished, in no acute distress  Skin : Warm and dry.  Head: Normocephalic and atraumatic  Eyes: Sclera and conjunctiva clear; pupils round and reactive to light; extraocular movements intact  Ears: External normal; canals clear; tympanic membranes normal  Oropharynx: Pink, supple. No suspicious lesions  Neck: Supple without thyromegaly, adenopathy  Lungs: Respirations unlabored; clear to auscultation bilaterally without wheeze, rales, rhonchi  CVS exam: normal rate and regular rhythm.  Abdomen: Soft; nontender; nondistended; normoactive bowel sounds; no masses or hepatosplenomegaly  Neurologic: Alert and oriented; speech intact; face symmetrical; moves all extremities well; CNII-XII intact without focal deficit   Assessment:  1. Acute gastroenteritis   2. Acute bronchospasm     Plan:  1. Reassurance; appears viral; Rx for Zofran 4 mg q 6-8 hours prn; BRAT diet; follow-up worse, no better. 2. Trial of BREO 100 mg qd x 10-14 days; do not feel further antibiotic needed; increase fluids, follow-up  worse, no better; if cough persists, will need to get CXR.    No follow-ups on file.  No orders of the defined types were placed in this encounter.   Requested Prescriptions   Signed Prescriptions Disp Refills  . ondansetron (ZOFRAN) 4 MG tablet 20 tablet 0    Sig: Take 1 tablet (4 mg total) by mouth every 8 (eight) hours as needed for nausea or vomiting.

## 2018-02-20 ENCOUNTER — Ambulatory Visit (INDEPENDENT_AMBULATORY_CARE_PROVIDER_SITE_OTHER): Payer: BC Managed Care – PPO | Admitting: Family Medicine

## 2018-02-20 ENCOUNTER — Encounter: Payer: Self-pay | Admitting: Family Medicine

## 2018-02-20 VITALS — BP 124/90 | HR 78 | Temp 98.4°F | Ht 63.5 in | Wt 183.1 lb

## 2018-02-20 DIAGNOSIS — R059 Cough, unspecified: Secondary | ICD-10-CM

## 2018-02-20 DIAGNOSIS — R05 Cough: Secondary | ICD-10-CM | POA: Diagnosis not present

## 2018-02-20 MED ORDER — BENZONATATE 100 MG PO CAPS
100.0000 mg | ORAL_CAPSULE | Freq: Three times a day (TID) | ORAL | 0 refills | Status: DC
Start: 2018-02-20 — End: 2019-01-15

## 2018-02-20 NOTE — Patient Instructions (Signed)
It was a pleasure to meet you today.  Your symptoms are most likely related to a viral illness. Please drink plenty of water so that your urine is pale yellow or clear. Also, get plenty of rest, benzonatate for cough,mucinex, and follow up if symptoms do not improve in 3 to 4 days, worsen, or you develop a fever >101.    Cough, Adult  A cough helps to clear your throat and lungs. A cough may last only 2-3 weeks (acute), or it may last longer than 8 weeks (chronic). Many different things can cause a cough. A cough may be a sign of an illness or another medical condition. Follow these instructions at home:  Pay attention to any changes in your cough.  Take medicines only as told by your doctor. ? If you were prescribed an antibiotic medicine, take it as told by your doctor. Do not stop taking it even if you start to feel better. ? Talk with your doctor before you try using a cough medicine.  Drink enough fluid to keep your pee (urine) clear or pale yellow.  If the air is dry, use a cold steam vaporizer or humidifier in your home.  Stay away from things that make you cough at work or at home.  If your cough is worse at night, try using extra pillows to raise your head up higher while you sleep.  Do not smoke, and try not to be around smoke. If you need help quitting, ask your doctor.  Do not have caffeine.  Do not drink alcohol.  Rest as needed. Contact a doctor if:  You have new problems (symptoms).  You cough up yellow fluid (pus).  Your cough does not get better after 2-3 weeks, or your cough gets worse.  Medicine does not help your cough and you are not sleeping well.  You have pain that gets worse or pain that is not helped with medicine.  You have a fever.  You are losing weight and you do not know why.  You have night sweats. Get help right away if:  You cough up blood.  You have trouble breathing.  Your heartbeat is very fast. This information is not  intended to replace advice given to you by your health care provider. Make sure you discuss any questions you have with your health care provider. Document Released: 09/28/2010 Document Revised: 06/23/2015 Document Reviewed: 03/24/2014 Elsevier Interactive Patient Education  2019 [employer].

## 2018-02-20 NOTE — Progress Notes (Signed)
Patient ID: Debbie Graves, female   DOB: 1970-09-14, 48 y.o.   MRN: [id number]  PCP: Biagio Borg, MD  Subjective:  Debbie Graves is a 48 y.o. year old very pleasant female patient who presents with symptoms including nasal and chest congestion, "irritated" throat, cough that is productive of yellow/green sputum.  One episode of associated wheezing has been present per patient. No recent antibiotic use. -started: 5 days ago , symptoms are improving today -previous treatments: Mucinex cough/cold medicine has provided limited benefit -sick contacts/travel/risks: denies flu exposure. Recent sick contact exposure at work.  No influenza vaccine this season -Hx of: seasonal allergies  ROS-denies fever, ear pain, SOB, NVD, tooth pain  Pertinent Past Medical History- Migraine, HTN, Allergic rhinitis, RA,   Medications- reviewed  Current Outpatient Medications  Medication Sig Dispense Refill  . albuterol (PROVENTIL HFA;VENTOLIN HFA) 108 (90 Base) MCG/ACT inhaler Inhale 2 puffs into the lungs every 6 (six) hours as needed for wheezing or shortness of breath. 1 Inhaler 1  . escitalopram (LEXAPRO) 10 MG tablet Take 10 mg by mouth daily.    . Fluocinolone Acetonide Scalp 0.01 % OIL APPLY TO SCALP DAILY FOR 2 WK, THEN EVERY OTHER DAY FOR 2 WKS, THEN DAILY AS NEEDED.  5  . levonorgestrel (MIRENA) 20 MCG/24HR IUD 1 each by Intrauterine route once.    Marland Kitchen losartan (COZAAR) 100 MG tablet Take 1 tablet (100 mg total) by mouth daily. 90 tablet 3  . etanercept (ENBREL) 50 MG/ML injection Inject 50 mg into the skin once a week.    . ondansetron (ZOFRAN) 4 MG tablet Take 1 tablet (4 mg total) by mouth every 8 (eight) hours as needed for nausea or vomiting. (Patient not taking: Reported on 02/20/2018) 20 tablet 0   No current facility-administered medications for this visit.     Objective: BP 124/90 (BP Location: Left Arm, Patient Position: Sitting, Cuff Size: Normal)   Pulse 78   Temp 98.4 F (36.9  C) (Oral)   Ht 5' 3.5" (1.613 m)   Wt 183 lb 1.9 oz (83.1 kg)   SpO2 98%   BMI 31.93 kg/m  Gen: NAD, resting comfortably HEENT: Turbinates mildly erythematous, TMs normal bilaterally, oropharynx is clear and moist with no exudate or edema, no sinus tenderness CV: RRR no murmurs rubs or gallops Lungs: CTAB no crackles, wheeze, rhonchi Ext: no edema Skin: warm, dry, no rash Neuro: grossly normal, moves all extremities  Assessment/Plan: 1. Cough Symptoms are improving, VSS and benign lung exam make pneumonia unlikely. Will provide benzonatate for cough, mucinex, and advised hydration, rest, and follow up if no improvement or worsening symptoms.  - benzonatate (TESSALON) 100 MG capsule; Take 1 capsule (100 mg total) by mouth 3 (three) times daily.  Dispense: 20 capsule; Refill: 0  We discussed that we did not find any infection that had higher probability of being bacterial such as pneumonia or strep throat. We discussed signs that bacterial infection may have developed particularly fever or shortness of breath. Likely course of 1-2 weeks.  Finally, we reviewed reasons to return to care including if symptoms worsen or persist or new concerns arise- once again particularly shortness of breath or fever.   Laurita Quint, FNP

## 2018-08-04 ENCOUNTER — Other Ambulatory Visit: Payer: Self-pay | Admitting: Internal Medicine

## 2019-01-15 ENCOUNTER — Ambulatory Visit (INDEPENDENT_AMBULATORY_CARE_PROVIDER_SITE_OTHER): Payer: BC Managed Care – PPO | Admitting: Internal Medicine

## 2019-01-15 ENCOUNTER — Encounter: Payer: Self-pay | Admitting: Internal Medicine

## 2019-01-15 ENCOUNTER — Other Ambulatory Visit: Payer: Self-pay

## 2019-01-15 ENCOUNTER — Other Ambulatory Visit (INDEPENDENT_AMBULATORY_CARE_PROVIDER_SITE_OTHER): Payer: BC Managed Care – PPO

## 2019-01-15 ENCOUNTER — Other Ambulatory Visit: Payer: Self-pay | Admitting: Internal Medicine

## 2019-01-15 VITALS — BP 140/90 | HR 84 | Temp 98.0°F | Ht 63.5 in | Wt 195.0 lb

## 2019-01-15 DIAGNOSIS — Z0000 Encounter for general adult medical examination without abnormal findings: Secondary | ICD-10-CM

## 2019-01-15 DIAGNOSIS — E611 Iron deficiency: Secondary | ICD-10-CM

## 2019-01-15 DIAGNOSIS — M069 Rheumatoid arthritis, unspecified: Secondary | ICD-10-CM

## 2019-01-15 DIAGNOSIS — Z0001 Encounter for general adult medical examination with abnormal findings: Secondary | ICD-10-CM

## 2019-01-15 DIAGNOSIS — M549 Dorsalgia, unspecified: Secondary | ICD-10-CM | POA: Diagnosis not present

## 2019-01-15 DIAGNOSIS — E559 Vitamin D deficiency, unspecified: Secondary | ICD-10-CM

## 2019-01-15 DIAGNOSIS — Z23 Encounter for immunization: Secondary | ICD-10-CM | POA: Diagnosis not present

## 2019-01-15 DIAGNOSIS — E538 Deficiency of other specified B group vitamins: Secondary | ICD-10-CM | POA: Diagnosis not present

## 2019-01-15 DIAGNOSIS — I10 Essential (primary) hypertension: Secondary | ICD-10-CM | POA: Diagnosis not present

## 2019-01-15 LAB — CBC WITH DIFFERENTIAL/PLATELET
Basophils Absolute: 0.1 10*3/uL (ref 0.0–0.1)
Basophils Relative: 0.7 % (ref 0.0–3.0)
Eosinophils Absolute: 0.1 10*3/uL (ref 0.0–0.7)
Eosinophils Relative: 1.5 % (ref 0.0–5.0)
HCT: 42.4 % (ref 36.0–46.0)
Hemoglobin: 13.7 g/dL (ref 12.0–15.0)
Lymphocytes Relative: 22.2 % (ref 12.0–46.0)
Lymphs Abs: 1.9 10*3/uL (ref 0.7–4.0)
MCHC: 32.2 g/dL (ref 30.0–36.0)
MCV: 89.7 fl (ref 78.0–100.0)
Monocytes Absolute: 0.6 10*3/uL (ref 0.1–1.0)
Monocytes Relative: 7.4 % (ref 3.0–12.0)
Neutro Abs: 5.7 10*3/uL (ref 1.4–7.7)
Neutrophils Relative %: 68.2 % (ref 43.0–77.0)
Platelets: 262 10*3/uL (ref 150.0–400.0)
RBC: 4.73 Mil/uL (ref 3.87–5.11)
RDW: 13.1 % (ref 11.5–15.5)
WBC: 8.4 10*3/uL (ref 4.0–10.5)

## 2019-01-15 LAB — HEPATIC FUNCTION PANEL
ALT: 9 U/L (ref 0–35)
AST: 14 U/L (ref 0–37)
Albumin: 4.2 g/dL (ref 3.5–5.2)
Alkaline Phosphatase: 82 U/L (ref 39–117)
Bilirubin, Direct: 0 mg/dL (ref 0.0–0.3)
Total Bilirubin: 0.3 mg/dL (ref 0.2–1.2)
Total Protein: 7.1 g/dL (ref 6.0–8.3)

## 2019-01-15 LAB — BASIC METABOLIC PANEL
BUN: 9 mg/dL (ref 6–23)
CO2: 29 mEq/L (ref 19–32)
Calcium: 9.3 mg/dL (ref 8.4–10.5)
Chloride: 102 mEq/L (ref 96–112)
Creatinine, Ser: 0.75 mg/dL (ref 0.40–1.20)
GFR: 100.55 mL/min (ref >60.00)
Glucose, Bld: 85 mg/dL (ref 70–99)
Potassium: 4 mEq/L (ref 3.5–5.1)
Sodium: 136 mEq/L (ref 135–145)

## 2019-01-15 LAB — LIPID PANEL
Cholesterol: 234 mg/dL — ABNORMAL HIGH (ref 0–200)
HDL: 52.1 mg/dL (ref >39.00)
LDL Cholesterol: 158 mg/dL — ABNORMAL HIGH (ref 0–99)
NonHDL: 182.34
Total CHOL/HDL Ratio: 4
Triglycerides: 121 mg/dL (ref 0.0–149.0)
VLDL: 24.2 mg/dL (ref 0.0–40.0)

## 2019-01-15 LAB — VITAMIN B12: Vitamin B-12: 321 pg/mL (ref 211–911)

## 2019-01-15 LAB — IBC PANEL
Iron: 93 ug/dL (ref 42–145)
Saturation Ratios: 31.6 % (ref 20.0–50.0)
Transferrin: 210 mg/dL — ABNORMAL LOW (ref 212.0–360.0)

## 2019-01-15 LAB — URINALYSIS, ROUTINE W REFLEX MICROSCOPIC
Bilirubin Urine: NEGATIVE
Ketones, ur: NEGATIVE
Leukocytes,Ua: NEGATIVE
Nitrite: NEGATIVE
Specific Gravity, Urine: 1.025 (ref 1.000–1.030)
Total Protein, Urine: NEGATIVE
Urine Glucose: NEGATIVE
Urobilinogen, UA: 0.2 (ref 0.0–1.0)
WBC, UA: NONE SEEN (ref 0–?)
pH: 6 (ref 5.0–8.0)

## 2019-01-15 LAB — TSH: TSH: 2.38 u[IU]/mL (ref 0.35–4.50)

## 2019-01-15 LAB — VITAMIN D 25 HYDROXY (VIT D DEFICIENCY, FRACTURES): VITD: 14.06 ng/mL — ABNORMAL LOW (ref 30.00–100.00)

## 2019-01-15 MED ORDER — TRAMADOL HCL 50 MG PO TABS: 50.0000 mg | ORAL_TABLET | Freq: Four times a day (QID) | ORAL | 0 refills | Status: DC | PRN

## 2019-01-15 MED ORDER — ESCITALOPRAM OXALATE 10 MG PO TABS: 10.0000 mg | ORAL_TABLET | Freq: Every day | ORAL | 3 refills | Status: DC

## 2019-01-15 MED ORDER — VITAMIN D (ERGOCALCIFEROL) 1.25 MG (50000 UNIT) PO CAPS: 50000.0000 [IU] | ORAL_CAPSULE | ORAL | 0 refills | Status: DC

## 2019-01-15 MED ORDER — LOSARTAN POTASSIUM 100 MG PO TABS: 100.0000 mg | ORAL_TABLET | Freq: Every day | ORAL | 3 refills | Status: DC

## 2019-01-15 MED ORDER — CYCLOBENZAPRINE HCL 5 MG PO TABS: 5.0000 mg | ORAL_TABLET | Freq: Three times a day (TID) | ORAL | 1 refills | Status: DC | PRN

## 2019-01-15 NOTE — Assessment & Plan Note (Signed)
stable overall by history and exam, recent data reviewed with pt, and pt to continue medical treatment as before,  to f/u any worsening symptoms or concerns  

## 2019-01-15 NOTE — Patient Instructions (Addendum)
You had the flu shot today, and the Tdap tetanus shot today  Please take all new medication as prescribed - the tramadol for pain, and the muscle relaxer  Please consider seeing one our Sports Medicine doctors if not improved in 1-2 weeks  Please continue all other medications as before, and refills have been done if requested.  Please have the pharmacy call with any other refills you may need.  Please continue your efforts at being more active, low cholesterol diet, and weight control.  You are otherwise up to date with prevention measures today.  Please keep your appointments with your specialists as you may have planned  Please go to the LAB in the Basement (turn left off the elevator) for the tests to be done today  You will be contacted by phone if any changes need to be made immediately.  Otherwise, you will receive a letter about your results with an explanation, but please check with MyChart first.  Please remember to sign up for MyChart if you have not done so, as this will be important to you in the future with finding out test results, communicating by private email, and scheduling acute appointments online when needed.  Please return in 1 year for your yearly visit, or sooner if needed, with Lab testing done 3-5 days before

## 2019-01-15 NOTE — Progress Notes (Signed)
Subjective:    Patient ID: Debbie Graves, female    DOB: 04/08/1970, 48 y.o.   MRN: [id number]  HPI  Here for wellness and f/u;  Overall doing ok;  Pt denies Chest pain, worsening SOB, DOE, wheezing, orthopnea, PND, worsening LE edema, palpitations, dizziness or syncope.  Pt denies neurological change such as new headache, facial or extremity weakness.  Pt denies polydipsia, polyuria, or low sugar symptoms. Pt states overall good compliance with treatment and medications, good tolerability, and has been trying to follow appropriate diet.  Pt denies worsening depressive symptoms, suicidal ideation or panic. No fever, night sweats, wt loss, loss of appetite, or other constitutional symptoms.  Pt states good ability with ADL's, has low fall risk, home safety reviewed and adequate, no other significant changes in hearing or vision, and only occasionally active with exercise. Ran out of BP meds,lost to f/u in the past yr  Gained wt with pandmic Wt Readings from Last 3 Encounters:  01/15/19 195 lb (88.5 kg)  02/20/18 183 lb 1.9 oz (83.1 kg)  08/23/17 179 lb 1.9 oz (81.2 kg)  Also with left neck pain and left upper trapezious  for 3 days, mild to mod, constant, dull, not better with a massager, worse to move arm and shoulder, non radicular.   Past Medical History:  Diagnosis Date   COMMON MIGRAINE    Essential hypertension, benign    NEURITIS, OPTIC NOS    Rheumatoid arthritis(714.0)    History reviewed. No pertinent surgical history.  reports that she has never smoked. She has never used smokeless tobacco. She reports current alcohol use. She reports that she does not use drugs. family history includes Breast cancer in an other family member; Coronary artery disease in an other family member; Heart disease in her daughter; Hyperlipidemia in an other family member; Hypertension in an other family member; Lung cancer in an other family member; Sarcoidosis in her mother and another family  member; Scleroderma in her father and another family member. Allergies  Allergen Reactions   Dimetapp Decongestant [Pseudoephedrine]     Reaction unknown   Pseudoephedrine-Dm     REACTION: not sure   Current Outpatient Medications on File Prior to Visit  Medication Sig Dispense Refill   albuterol (PROVENTIL HFA;VENTOLIN HFA) 108 (90 Base) MCG/ACT inhaler Inhale 2 puffs into the lungs every 6 (six) hours as needed for wheezing or shortness of breath. 1 Inhaler 1   etanercept (ENBREL) 50 MG/ML injection Inject 50 mg into the skin once a week.     Fluocinolone Acetonide Scalp 0.01 % OIL APPLY TO SCALP DAILY FOR 2 WK, THEN EVERY OTHER DAY FOR 2 WKS, THEN DAILY AS NEEDED.  5   levonorgestrel (MIRENA) 20 MCG/24HR IUD 1 each by Intrauterine route once.     No current facility-administered medications on file prior to visit.   Review of Systems Constitutional: Negative for other unusual diaphoresis, sweats, appetite or weight changes HENT: Negative for other worsening hearing loss, ear pain, facial swelling, mouth sores or neck stiffness.   Eyes: Negative for other worsening pain, redness or other visual disturbance.  Respiratory: Negative for other stridor or swelling Cardiovascular: Negative for other palpitations or other chest pain  Gastrointestinal: Negative for worsening diarrhea or loose stools, blood in stool, distention or other pain Genitourinary: Negative for hematuria, flank pain or other change in urine volume.  Musculoskeletal: Negative for myalgias or other joint swelling.  Skin: Negative for other color change, or other wound or worsening  drainage.  Neurological: Negative for other syncope or numbness. Hematological: Negative for other adenopathy or swelling Psychiatric/Behavioral: Negative for hallucinations, other worsening agitation, SI, self-injury, or new decreased concentration All otherwise neg per pt     Objective:   Physical Exam BP 140/90    Pulse 84    Temp  98 F (36.7 C) (Oral)    Ht 5' 3.5" (1.613 m)    Wt 195 lb (88.5 kg)    SpO2 97%    BMI 34.00 kg/m  VS noted,  Constitutional: Pt is oriented to person, place, and time. Appears well-developed and well-nourished, in no significant distress and comfortable Head: Normocephalic and atraumatic  Eyes: Conjunctivae and EOM are normal. Pupils are equal, round, and reactive to light Right Ear: External ear normal without discharge Left Ear: External ear normal without discharge Nose: Nose without discharge or deformity Mouth/Throat: Oropharynx is without other ulcerations and moist  Neck: Normal range of motion. Neck supple. No JVD present. No tracheal deviation present or significant neck LA or mass Cardiovascular: Normal rate, regular rhythm, normal heart sounds and intact distal pulses.   Pulmonary/Chest: WOB normal and breath sounds without rales or wheezing  Abdominal: Soft. Bowel sounds are normal. NT. No HSM  Musculoskeletal: Normal range of motion. Exhibits no edema, + tender left trapezoid Lymphadenopathy: Has no other cervical adenopathy.  Neurological: Pt is alert and oriented to person, place, and time. Pt has normal reflexes. No cranial nerve deficit. Motor grossly intact, Gait intact Skin: Skin is warm and dry. No rash noted or new ulcerations Psychiatric:  Has normal mood and affect. Behavior is normal without agitation All otherwise neg per pt Lab Results  Component Value Date   WBC 8.4 01/15/2019   HGB 13.7 01/15/2019   HCT 42.4 01/15/2019   PLT 262.0 01/15/2019   GLUCOSE 85 01/15/2019   CHOL 234 (H) 01/15/2019   TRIG 121.0 01/15/2019   HDL 52.10 01/15/2019   LDLCALC 158 (H) 01/15/2019   ALT 9 01/15/2019   AST 14 01/15/2019   NA 136 01/15/2019   K 4.0 01/15/2019   CL 102 01/15/2019   CREATININE 0.75 01/15/2019   BUN 9 01/15/2019   CO2 29 01/15/2019   TSH 2.38 01/15/2019        Assessment & Plan:

## 2019-01-15 NOTE — Assessment & Plan Note (Addendum)
C/w msk spasm, for tramadol prn, flexeril prn, consider sports med if not improved  In addition to the time spent performing CPE, I spent an additional 15 minutes face to face,in which greater than 50% of this time was spent in counseling and coordination of care for patient's illness as documented, including the differential dx, treatment, further evaluation and other management of left upper back pain, HTN, RA

## 2019-01-15 NOTE — Assessment & Plan Note (Signed)

## 2019-07-07 ENCOUNTER — Other Ambulatory Visit: Payer: Self-pay | Admitting: Internal Medicine

## 2019-07-07 NOTE — Telephone Encounter (Signed)
Please let pt know to change to OTC Vitamin D3 at 2000 units per day, indefinitely.  

## 2019-07-08 NOTE — Telephone Encounter (Signed)
LVM for pt to start OTC Vit D3 at 2000 units once a day.

## 2020-01-13 ENCOUNTER — Other Ambulatory Visit: Payer: Self-pay | Admitting: Internal Medicine

## 2020-01-13 NOTE — Telephone Encounter (Signed)
Please refill as per office routine med refill policy (all routine meds refilled for 3 mo or monthly per pt preference up to one year from last visit, then month to month grace period for 3 mo, then further med refills will have to be denied)  

## 2020-01-18 ENCOUNTER — Other Ambulatory Visit: Payer: Self-pay

## 2020-01-19 ENCOUNTER — Ambulatory Visit (INDEPENDENT_AMBULATORY_CARE_PROVIDER_SITE_OTHER): Payer: BC Managed Care – PPO | Admitting: Internal Medicine

## 2020-01-19 ENCOUNTER — Encounter: Payer: Self-pay | Admitting: Internal Medicine

## 2020-01-19 VITALS — BP 130/90 | HR 69 | Temp 98.3°F | Ht 63.5 in | Wt 192.0 lb

## 2020-01-19 DIAGNOSIS — I10 Essential (primary) hypertension: Secondary | ICD-10-CM

## 2020-01-19 DIAGNOSIS — Z1159 Encounter for screening for other viral diseases: Secondary | ICD-10-CM

## 2020-01-19 DIAGNOSIS — R3129 Other microscopic hematuria: Secondary | ICD-10-CM

## 2020-01-19 DIAGNOSIS — Z0000 Encounter for general adult medical examination without abnormal findings: Secondary | ICD-10-CM

## 2020-01-19 DIAGNOSIS — E559 Vitamin D deficiency, unspecified: Secondary | ICD-10-CM | POA: Diagnosis not present

## 2020-01-19 DIAGNOSIS — F419 Anxiety disorder, unspecified: Secondary | ICD-10-CM

## 2020-01-19 DIAGNOSIS — E7849 Other hyperlipidemia: Secondary | ICD-10-CM

## 2020-01-19 DIAGNOSIS — Z0001 Encounter for general adult medical examination with abnormal findings: Secondary | ICD-10-CM

## 2020-01-19 LAB — URINALYSIS, ROUTINE W REFLEX MICROSCOPIC
Bilirubin Urine: NEGATIVE
Ketones, ur: NEGATIVE
Leukocytes,Ua: NEGATIVE
Nitrite: NEGATIVE
Specific Gravity, Urine: >=1.03 — AB (ref 1.000–1.030)
Total Protein, Urine: NEGATIVE
Urine Glucose: NEGATIVE
Urobilinogen, UA: 0.2 (ref 0.0–1.0)
pH: 5.5 (ref 5.0–8.0)

## 2020-01-19 LAB — CBC WITH DIFFERENTIAL/PLATELET
Basophils Absolute: 0 10*3/uL (ref 0.0–0.1)
Basophils Relative: 0.6 % (ref 0.0–3.0)
Eosinophils Absolute: 0.1 10*3/uL (ref 0.0–0.7)
Eosinophils Relative: 0.9 % (ref 0.0–5.0)
HCT: 43.8 % (ref 36.0–46.0)
Hemoglobin: 14.1 g/dL (ref 12.0–15.0)
Lymphocytes Relative: 16.2 % (ref 12.0–46.0)
Lymphs Abs: 1.1 10*3/uL (ref 0.7–4.0)
MCHC: 32.1 g/dL (ref 30.0–36.0)
MCV: 89.9 fl (ref 78.0–100.0)
Monocytes Absolute: 0.6 10*3/uL (ref 0.1–1.0)
Monocytes Relative: 9.2 % (ref 3.0–12.0)
Neutro Abs: 5.1 10*3/uL (ref 1.4–7.7)
Neutrophils Relative %: 73.1 % (ref 43.0–77.0)
Platelets: 277 10*3/uL (ref 150.0–400.0)
RBC: 4.87 Mil/uL (ref 3.87–5.11)
RDW: 13.2 % (ref 11.5–15.5)
WBC: 7 10*3/uL (ref 4.0–10.5)

## 2020-01-19 LAB — HEPATIC FUNCTION PANEL
ALT: 10 U/L (ref 0–35)
AST: 18 U/L (ref 0–37)
Albumin: 4.2 g/dL (ref 3.5–5.2)
Alkaline Phosphatase: 82 U/L (ref 39–117)
Bilirubin, Direct: 0.1 mg/dL (ref 0.0–0.3)
Total Bilirubin: 0.4 mg/dL (ref 0.2–1.2)
Total Protein: 7.3 g/dL (ref 6.0–8.3)

## 2020-01-19 LAB — BASIC METABOLIC PANEL
BUN: 11 mg/dL (ref 6–23)
CO2: 30 mEq/L (ref 19–32)
Calcium: 9.4 mg/dL (ref 8.4–10.5)
Chloride: 103 mEq/L (ref 96–112)
Creatinine, Ser: 0.87 mg/dL (ref 0.40–1.20)
GFR: 79.43 mL/min (ref >60.00)
Glucose, Bld: 83 mg/dL (ref 70–99)
Potassium: 4 mEq/L (ref 3.5–5.1)
Sodium: 138 mEq/L (ref 135–145)

## 2020-01-19 LAB — LIPID PANEL
Cholesterol: 220 mg/dL — ABNORMAL HIGH (ref 0–200)
HDL: 61.9 mg/dL (ref >39.00)
LDL Cholesterol: 140 mg/dL — ABNORMAL HIGH (ref 0–99)
NonHDL: 158.23
Total CHOL/HDL Ratio: 4
Triglycerides: 93 mg/dL (ref 0.0–149.0)
VLDL: 18.6 mg/dL (ref 0.0–40.0)

## 2020-01-19 LAB — VITAMIN D 25 HYDROXY (VIT D DEFICIENCY, FRACTURES): VITD: 26.77 ng/mL — ABNORMAL LOW (ref 30.00–100.00)

## 2020-01-19 LAB — TSH: TSH: 2 u[IU]/mL (ref 0.35–4.50)

## 2020-01-19 NOTE — Patient Instructions (Signed)
Please take OTC Vitamin D3 at 2000 units per day, indefinitely.  Please continue to monitor your BP at home for 10 days, and let us know the result if the average is more than 140/90  Please continue all other medications as before, and refills have been done if requested.  Please have the pharmacy call with any other refills you may need.  Please continue your efforts at being more active, low cholesterol diet, and weight control.  You are otherwise up to date with prevention measures today.  Please keep your appointments with your specialists as you may have planned  Please go to the LAB at the blood drawing area for the tests to be done  You will be contacted by phone if any changes need to be made immediately.  Otherwise, you will receive a letter about your results with an explanation, but please check with MyChart first.  Please remember to sign up for MyChart if you have not done so, as this will be important to you in the future with finding out test results, communicating by private email, and scheduling acute appointments online when needed.  Please make an Appointment to return for your 1 year visit, or sooner if needed

## 2020-01-19 NOTE — Progress Notes (Deleted)
   Subjective:    Patient ID: Debbie Graves, female    DOB: 03/01/1970, 49 y.o.   MRN: [id number]  HPI      Review of Systems     Objective:   Physical Exam BP 130/90 (BP Location: Left Arm, Patient Position: Sitting, Cuff Size: Large)   Pulse 69   Temp 98.3 F (36.8 C) (Oral)   Ht 5' 3.5" (1.613 m)   Wt 192 lb (87.1 kg)   SpO2 98%   BMI 33.48 kg/m         Assessment & Plan:

## 2020-01-20 LAB — HEPATITIS C ANTIBODY
Hepatitis C Ab: NONREACTIVE
SIGNAL TO CUT-OFF: 0.01 (ref <1.00)

## 2020-01-26 ENCOUNTER — Encounter: Payer: Self-pay | Admitting: Internal Medicine

## 2020-01-26 DIAGNOSIS — E785 Hyperlipidemia, unspecified: Secondary | ICD-10-CM | POA: Insufficient documentation

## 2020-01-26 DIAGNOSIS — R3129 Other microscopic hematuria: Secondary | ICD-10-CM | POA: Insufficient documentation

## 2020-01-26 NOTE — Assessment & Plan Note (Signed)
stable overall by history and exam, recent data reviewed with pt, and pt to continue medical treatment as before,  to f/u any worsening symptoms or concerns  

## 2020-01-26 NOTE — Assessment & Plan Note (Signed)

## 2020-01-26 NOTE — Assessment & Plan Note (Signed)
Recently transiently elevated at home, now improved for unclear reasons,  to f/u any worsening symptoms or concerns and font same tx, cont to monitor at home BP Readings from Last 3 Encounters:  01/19/20 130/90  01/15/19 140/90  02/20/18 124/90

## 2020-01-26 NOTE — Assessment & Plan Note (Signed)
Last vitamin D Lab Results  Component Value Date   VD25OH 26.77 (L) 01/19/2020   To start otc vit d 2000 u qd

## 2020-01-26 NOTE — Assessment & Plan Note (Signed)
Asympt, exam benign, to recheck ua

## 2020-01-26 NOTE — Assessment & Plan Note (Signed)
Lab Results  Component Value Date   LDLCALC 140 (H) 01/19/2020  mod elevated, declines statin for now, for lower chol diet

## 2020-01-26 NOTE — Progress Notes (Addendum)
Established Patient Office Visit  Subjective:  Patient ID: Debbie Graves, female    DOB: 1970/04/23  Age: 49 y.o. MRN: [id number]       Chief Complaint: (concise statement describing the symptom, problem, condition, diagnosis, physician recommended return, or other factor as reason for encounter): wellness and vit d def, hld, microhematuria, and transient uncontrolled htn       HPI:  [name] is a 49 y.o. female here for wellness overall doing ok;  Pt denies Chest pain, worsening SOB, DOE, wheezing, orthopnea, PND, worsening LE edema, palpitations, dizziness or syncope.  Pt denies neurological change such as new headache, facial or extremity weakness.  Pt denies polydipsia, polyuria. Pt states overall good compliance with treatment and medications, good medication tolerability, and has been trying to follow appropriate diet.  Pt denies worsening depressive symptoms, suicidal ideation or panic. Denies fever, night sweats, wt loss, loss of appetite, or other constitutional symptoms.  Pt states good ability with ADL's, has low fall risk, home safety reviewed and adequate, no other significant changes in hearing or vision, and occasionally active with exercise.      Denies worsening depressive symptoms, suicidal ideation, or panic  Wt Readings from Last 3 Encounters:  01/19/20 192 lb (87.1 kg)  01/15/19 195 lb (88.5 kg)  02/20/18 183 lb 1.9 oz (83.1 kg)   BP Readings from Last 3 Encounters:  01/19/20 130/90  01/15/19 140/90  02/20/18 124/90        Also c/o acute problem of: Denies urinary symptoms such as dysuria, frequency, urgency, flank pain, hematuria or n/v, fever, chills.  Not taking Vit d currently.  BP has been mildly elevated 2 wks ago, but seems back to normal in the past week  Past Medical History:  Diagnosis Date  . COMMON MIGRAINE   . Essential hypertension, benign   . NEURITIS, OPTIC NOS   . Rheumatoid arthritis(714.0)    History reviewed. No pertinent surgical  history.  reports that she has never smoked. She has never used smokeless tobacco. She reports current alcohol use. She reports that she does not use drugs. family history includes Breast cancer in an other family member; Coronary artery disease in an other family member; Heart disease in her daughter; Hyperlipidemia in an other family member; Hypertension in an other family member; Lung cancer in an other family member; Sarcoidosis in her mother and another family member; Scleroderma in her father and another family member. Allergies  Allergen Reactions  . Dimetapp Decongestant [Pseudoephedrine]     Reaction unknown  . Pseudoephedrine-Dm     REACTION: not sure   Current Outpatient Medications on File Prior to Visit  Medication Sig Dispense Refill  . escitalopram (LEXAPRO) 10 MG tablet Take 1 tablet (10 mg total) by mouth daily. 90 tablet 3  . levonorgestrel (MIRENA) 20 MCG/24HR IUD 1 each by Intrauterine route once.    Marland Kitchen losartan (COZAAR) 100 MG tablet TAKE 1 TABLET BY MOUTH EVERY DAY 90 tablet 3  . XELJANZ XR 11 MG TB24 Take 1 tablet by mouth daily.     No current facility-administered medications on file prior to visit.        ROS:  All others reviewed and negative.  Objective        PE:  BP 130/90 (BP Location: Left Arm, Patient Position: Sitting, Cuff Size: Large)   Pulse 69   Temp 98.3 F (36.8 C) (Oral)   Ht 5' 3.5" (1.613 m)   Wt 192 lb (  87.1 kg)   SpO2 98%   BMI 33.48 kg/m                 Constitutional: Pt appears in NAD               HENT: Head: NCAT.                Right Ear: External ear normal.                 Left Ear: External ear normal.                Eyes: . Pupils are equal, round, and reactive to light. Conjunctivae and EOM are normal               Nose: without d/c or deformity               Neck: Neck supple. Gross normal ROM               Cardiovascular: Normal rate and regular rhythm.                 Pulmonary/Chest: Effort normal and breath sounds  without rales or wheezing.                Abd:  Soft, NT, ND, + BS, no organomegaly               Neurological: Pt is alert. At baseline orientation, motor grossly intact               Skin: Skin is warm. No rashes, no other new lesions, LE edema - none               Psychiatric: Pt behavior is normal without agitation   Assessment/Plan:  [name] is a 49 y.o. Black or African American [2] female with  has a past medical history of COMMON MIGRAINE, Essential hypertension, benign, NEURITIS, OPTIC NOS, and Rheumatoid arthritis(714.0).  Assessment Plan  See notes Labs reviewed for each problem: Lab Results  Component Value Date   WBC 7.0 01/19/2020   HGB 14.1 01/19/2020   HCT 43.8 01/19/2020   PLT 277.0 01/19/2020   GLUCOSE 83 01/19/2020   CHOL 220 (H) 01/19/2020   TRIG 93.0 01/19/2020   HDL 61.90 01/19/2020   LDLCALC 140 (H) 01/19/2020   ALT 10 01/19/2020   AST 18 01/19/2020   NA 138 01/19/2020   K 4.0 01/19/2020   CL 103 01/19/2020   CREATININE 0.87 01/19/2020   BUN 11 01/19/2020   CO2 30 01/19/2020   TSH 2.00 01/19/2020    Micro: none  Cardiac tracings I have personally interpreted today:  none  Pertinent Radiological findings (summarize): none   I spent total 33 minutes in addition to wellness in caring for the patient for this visit:  1) by communicating with the patient during the visit  2) by review of pertinent vital sign data, physical examination and labs as documented in the assessment and plan  3) by review of pertinent imaging - none today  4) by review of pertinent procedures - none today  5) by obtaining and reviewing separately obtained information from family/caretaker and Care Everywhere - none today  6) by ordering medications  7) by ordering tests  8) by documenting all of this clinical information in the EHR including the management of each problem noted today in assessment and plan   Health Maintenance Due  Topic Date Due  .  COLONOSCOPY (Pts 45-62yrs Insurance coverage will need to be confirmed)  Never done    There are no preventive care reminders to display for this patient.   Problem List Items Addressed This Visit      Medium   Vitamin D deficiency    Last vitamin D Lab Results  Component Value Date   VD25OH 26.77 (L) 01/19/2020   To start otc vit d 2000 u qd       Relevant Orders   VITAMIN D 25 Hydroxy (Vit-D Deficiency, Fractures) (Completed)   HLD (hyperlipidemia)    Lab Results  Component Value Date   LDLCALC 140 (H) 01/19/2020  mod elevated, declines statin for now, for lower chol diet      Essential hypertension, benign    Recently transiently elevated at home, now improved for unclear reasons,  to f/u any worsening symptoms or concerns and font same tx, cont to monitor at home BP Readings from Last 3 Encounters:  01/19/20 130/90  01/15/19 140/90  02/20/18 124/90        Relevant Orders   Lipid panel (Completed)   Hepatic function panel (Completed)   CBC with Differential/Platelet (Completed)   TSH (Completed)   Urinalysis, Routine w reflex microscopic (Completed)   Basic metabolic panel (Completed)   Anxiety    stable overall by history and exam, recent data reviewed with pt, and pt to continue medical treatment as before,  to f/u any worsening symptoms or concerns         Low   Microhematuria    Asympt, exam benign, to recheck ua        Unprioritized   Encounter for well adult exam with abnormal findings - Primary    Overall doing well, age appropriate education and counseling updated, referrals for preventative services and immunizations addressed, dietary and smoking counseling addressed, most recent labs reviewed.  I have personally reviewed and have noted:  1) the patient's medical and social history 2) The pt's use of alcohol, tobacco, and illicit drugs 3) The patient's current medications and supplements 4) Functional ability including ADL's, fall risk, home  safety risk, hearing and visual impairment 5) Diet and physical activities 6) Evidence for depression or mood disorder 7) The patient's height, weight, and BMI have been recorded in the chart  I have made referrals, and provided counseling and education based on review of the above        Other Visit Diagnoses    Need for hepatitis C screening test       Relevant Orders   Hepatitis C Antibody (Completed)      No orders of the defined types were placed in this encounter.   Follow-up: Return in about 1 year (around 01/18/2021).    Cathlean Cower, MD 01/26/2020 9:10 PM [facility]

## 2020-03-11 DIAGNOSIS — L409 Psoriasis, unspecified: Secondary | ICD-10-CM | POA: Diagnosis not present

## 2020-03-11 DIAGNOSIS — M2550 Pain in unspecified joint: Secondary | ICD-10-CM | POA: Diagnosis not present

## 2020-03-11 DIAGNOSIS — M1990 Unspecified osteoarthritis, unspecified site: Secondary | ICD-10-CM | POA: Diagnosis not present

## 2020-03-11 DIAGNOSIS — M0589 Other rheumatoid arthritis with rheumatoid factor of multiple sites: Secondary | ICD-10-CM | POA: Diagnosis not present

## 2020-03-17 ENCOUNTER — Other Ambulatory Visit: Payer: Self-pay | Admitting: Internal Medicine

## 2020-03-17 NOTE — Telephone Encounter (Signed)
lexapro ok for 1 mo refill  Please contact pt - please for rov for further refills

## 2020-03-28 DIAGNOSIS — Z01419 Encounter for gynecological examination (general) (routine) without abnormal findings: Secondary | ICD-10-CM | POA: Diagnosis not present

## 2020-03-28 DIAGNOSIS — Z6832 Body mass index (BMI) 32.0-32.9, adult: Secondary | ICD-10-CM | POA: Diagnosis not present

## 2020-03-28 DIAGNOSIS — Z1231 Encounter for screening mammogram for malignant neoplasm of breast: Secondary | ICD-10-CM | POA: Diagnosis not present

## 2020-04-01 ENCOUNTER — Other Ambulatory Visit: Payer: Self-pay

## 2020-04-01 ENCOUNTER — Ambulatory Visit (INDEPENDENT_AMBULATORY_CARE_PROVIDER_SITE_OTHER): Payer: BC Managed Care – PPO | Admitting: Internal Medicine

## 2020-04-01 VITALS — BP 142/110 | HR 81 | Ht 63.5 in | Wt 189.0 lb

## 2020-04-01 DIAGNOSIS — I10 Essential (primary) hypertension: Secondary | ICD-10-CM | POA: Diagnosis not present

## 2020-04-01 DIAGNOSIS — E559 Vitamin D deficiency, unspecified: Secondary | ICD-10-CM | POA: Diagnosis not present

## 2020-04-01 DIAGNOSIS — E7800 Pure hypercholesterolemia, unspecified: Secondary | ICD-10-CM | POA: Diagnosis not present

## 2020-04-01 DIAGNOSIS — F419 Anxiety disorder, unspecified: Secondary | ICD-10-CM

## 2020-04-01 MED ORDER — ROSUVASTATIN CALCIUM 20 MG PO TABS: 20.0000 mg | ORAL_TABLET | Freq: Every day | ORAL | 3 refills | Status: DC

## 2020-04-01 MED ORDER — OLMESARTAN MEDOXOMIL 40 MG PO TABS
40.0000 mg | ORAL_TABLET | Freq: Every day | ORAL | 3 refills | Status: DC
Start: 2020-04-01 — End: 2021-05-02

## 2020-04-01 MED ORDER — AMLODIPINE BESYLATE 10 MG PO TABS: 10.0000 mg | ORAL_TABLET | Freq: Every day | ORAL | 3 refills | Status: DC

## 2020-04-01 NOTE — Progress Notes (Signed)
Patient ID: MICHEALE SCHLACK, female   DOB: Apr 16, 1970, 50 y.o.   MRN: [id number]        Chief Complaint: uncontrolled htn, hld, vit d def, anxiety       HPI:  Debbie Graves is a 50 y.o. female here with c/o persstent elev bp recently for unclear reason since has been working on better diet, low na, wt loss better control, and being more active.  BP a GYN yesterday was 158/95, and several similar at home.  Pt denies chest pain, increased sob or doe, wheezing, orthopnea, PND, increased LE swelling, palpitations, dizziness or syncope.   Pt denies polydipsia, polyuria, denies new worsening focal neuro s/s.  Trying to follow lower chol diet.  Taking Vit d.  Denies worsening depressive symptoms, suicidal ideation, or panic; Wt Readings from Last 3 Encounters:  04/01/20 189 lb (85.7 kg)  01/19/20 192 lb (87.1 kg)  01/15/19 195 lb (88.5 kg)   BP Readings from Last 3 Encounters:  04/01/20 (!) 142/110  01/19/20 130/90  01/15/19 140/90         Past Medical History:  Diagnosis Date  . COMMON MIGRAINE   . Essential hypertension, benign   . NEURITIS, OPTIC NOS   . Rheumatoid arthritis(714.0)    History reviewed. No pertinent surgical history.  reports that she has never smoked. She has never used smokeless tobacco. She reports current alcohol use. She reports that she does not use drugs. family history includes Breast cancer in an other family member; Coronary artery disease in an other family member; Heart disease in her daughter; Hyperlipidemia in an other family member; Hypertension in an other family member; Lung cancer in an other family member; Sarcoidosis in her mother and another family member; Scleroderma in her father and another family member. Allergies  Allergen Reactions  . Dimetapp Decongestant [Pseudoephedrine]     Reaction unknown  . Pseudoephedrine-Dm     REACTION: not sure   Current Outpatient Medications on File Prior to Visit  Medication Sig Dispense Refill  .  escitalopram (LEXAPRO) 10 MG tablet TAKE 1 TABLET BY MOUTH EVERY DAY 30 tablet 0  . levonorgestrel (MIRENA) 20 MCG/24HR IUD 1 each by Intrauterine route once.    Marland Kitchen losartan (COZAAR) 100 MG tablet TAKE 1 TABLET BY MOUTH EVERY DAY 90 tablet 3  . XELJANZ XR 11 MG TB24 Take 1 tablet by mouth daily.     No current facility-administered medications on file prior to visit.        ROS:  All others reviewed and negative.  Objective        PE:  BP (!) 142/110   Pulse 81   Ht 5' 3.5" (1.613 m)   Wt 189 lb (85.7 kg)   SpO2 98%   BMI 32.95 kg/m                 Constitutional: Pt appears in NAD               HENT: Head: NCAT.                Right Ear: External ear normal.                 Left Ear: External ear normal.                Eyes: . Pupils are equal, round, and reactive to light. Conjunctivae and EOM are normal  Nose: without d/c or deformity               Neck: Neck supple. Gross normal ROM               Cardiovascular: Normal rate and regular rhythm.                 Pulmonary/Chest: Effort normal and breath sounds without rales or wheezing.                Abd:  Soft, NT, ND, + BS, no organomegaly               Neurological: Pt is alert. At baseline orientation, motor grossly intact               Skin: Skin is warm. No rashes, no other new lesions, LE edema - none               Psychiatric: Pt behavior is normal without agitation   Micro: none  Cardiac tracings I have personally interpreted today:  none  Pertinent Radiological findings (summarize): none   Lab Results  Component Value Date   WBC 7.0 01/19/2020   HGB 14.1 01/19/2020   HCT 43.8 01/19/2020   PLT 277.0 01/19/2020   GLUCOSE 83 01/19/2020   CHOL 220 (H) 01/19/2020   TRIG 93.0 01/19/2020   HDL 61.90 01/19/2020   LDLCALC 140 (H) 01/19/2020   ALT 10 01/19/2020   AST 18 01/19/2020   NA 138 01/19/2020   K 4.0 01/19/2020   CL 103 01/19/2020   CREATININE 0.87 01/19/2020   BUN 11 01/19/2020   CO2 30  01/19/2020   TSH 2.00 01/19/2020   Assessment/Plan:  DIAMOND JENTZ is a 50 y.o. Black or African American [2] female with  has a past medical history of COMMON MIGRAINE, Essential hypertension, benign, NEURITIS, OPTIC NOS, and Rheumatoid arthritis(714.0).  Hypertension, uncontrolled Uncontrolled, to chagne losartan to benic14 40, and add amlodipine 10 qd, and f/u BP at home and next visit  HLD (hyperlipidemia) Lab Results  Component Value Date   LDLCALC 140 (H) 01/19/2020   Stable, pt to continue current statin crestor 20, declines lab today, cont low chol diet  Current Outpatient Medications (Endocrine & Metabolic):  .  levonorgestrel (MIRENA) 20 MCG/24HR IUD, 1 each by Intrauterine route once.  Current Outpatient Medications (Cardiovascular):  .  amLODipine (NORVASC) 10 MG tablet, Take 1 tablet (10 mg total) by mouth daily. Marland Kitchen  losartan (COZAAR) 100 MG tablet, TAKE 1 TABLET BY MOUTH EVERY DAY .  olmesartan (BENICAR) 40 MG tablet, Take 1 tablet (40 mg total) by mouth daily. .  rosuvastatin (CRESTOR) 20 MG tablet, Take 1 tablet (20 mg total) by mouth daily.   Current Outpatient Medications (Analgesics):  Marland Kitchen  XELJANZ XR 11 MG TB24, Take 1 tablet by mouth daily.   Current Outpatient Medications (Other):  .  escitalopram (LEXAPRO) 10 MG tablet, TAKE 1 TABLET BY MOUTH EVERY DAY  Anxiety Overall stable, cont current med tx - lexapro   Vitamin D deficiency Last vitamin D Lab Results  Component Value Date   VD25OH 26.77 (L) 01/19/2020   Low, to start vit d 2000 u qd, oral replacement   Followup: Return in about 6 weeks (around 05/13/2020).  Cathlean Cower, MD 04/03/2020 2:56 PM [facility]

## 2020-04-01 NOTE — Patient Instructions (Addendum)
Ok to stop the losartan  Please take all new medication as prescribed - the olmesartan (benicar) and amlodipine  Please also start the crestor 20 mg after a week on the other 2 new medications to make sure no questions about side effects  Please continue all other medications as before, and refills have been done if requested.  Please have the pharmacy call with any other refills you may need.  Please continue your efforts at being more active, low cholesterol diet, and weight control.  Please keep your appointments with your specialists as you may have planned  Please make an Appointment to return in 6 weeks

## 2020-04-03 ENCOUNTER — Encounter: Payer: Self-pay | Admitting: Internal Medicine

## 2020-04-03 NOTE — Assessment & Plan Note (Signed)
Last vitamin D Lab Results  Component Value Date   VD25OH 26.77 (L) 01/19/2020   Low, to start vit d 2000 u qd, oral replacement

## 2020-04-03 NOTE — Assessment & Plan Note (Signed)
Uncontrolled, to chagne losartan to benic14 40, and add amlodipine 10 qd, and f/u BP at home and next visit

## 2020-04-03 NOTE — Assessment & Plan Note (Signed)
Lab Results  Component Value Date   LDLCALC 140 (H) 01/19/2020   Stable, pt to continue current statin crestor 20, declines lab today, cont low chol diet  Current Outpatient Medications (Endocrine & Metabolic):  .  levonorgestrel (MIRENA) 20 MCG/24HR IUD, 1 each by Intrauterine route once.  Current Outpatient Medications (Cardiovascular):  .  amLODipine (NORVASC) 10 MG tablet, Take 1 tablet (10 mg total) by mouth daily. Marland Kitchen  losartan (COZAAR) 100 MG tablet, TAKE 1 TABLET BY MOUTH EVERY DAY .  olmesartan (BENICAR) 40 MG tablet, Take 1 tablet (40 mg total) by mouth daily. .  rosuvastatin (CRESTOR) 20 MG tablet, Take 1 tablet (20 mg total) by mouth daily.   Current Outpatient Medications (Analgesics):  Marland Kitchen  XELJANZ XR 11 MG TB24, Take 1 tablet by mouth daily.   Current Outpatient Medications (Other):  .  escitalopram (LEXAPRO) 10 MG tablet, TAKE 1 TABLET BY MOUTH EVERY DAY

## 2020-04-03 NOTE — Assessment & Plan Note (Signed)
Overall stable, cont current med tx - lexapro

## 2020-04-08 ENCOUNTER — Other Ambulatory Visit: Payer: Self-pay | Admitting: Internal Medicine

## 2020-07-06 DIAGNOSIS — L811 Chloasma: Secondary | ICD-10-CM | POA: Diagnosis not present

## 2020-07-06 DIAGNOSIS — L249 Irritant contact dermatitis, unspecified cause: Secondary | ICD-10-CM | POA: Diagnosis not present

## 2020-07-06 DIAGNOSIS — L700 Acne vulgaris: Secondary | ICD-10-CM | POA: Diagnosis not present

## 2020-07-07 ENCOUNTER — Encounter: Payer: Self-pay | Admitting: Internal Medicine

## 2020-07-07 ENCOUNTER — Ambulatory Visit (INDEPENDENT_AMBULATORY_CARE_PROVIDER_SITE_OTHER): Payer: BC Managed Care – PPO | Admitting: Internal Medicine

## 2020-07-07 ENCOUNTER — Other Ambulatory Visit: Payer: Self-pay

## 2020-07-07 VITALS — BP 130/80 | HR 79 | Temp 98.6°F | Ht 63.5 in | Wt 184.2 lb

## 2020-07-07 DIAGNOSIS — E559 Vitamin D deficiency, unspecified: Secondary | ICD-10-CM | POA: Diagnosis not present

## 2020-07-07 DIAGNOSIS — J309 Allergic rhinitis, unspecified: Secondary | ICD-10-CM | POA: Diagnosis not present

## 2020-07-07 DIAGNOSIS — H6981 Other specified disorders of Eustachian tube, right ear: Secondary | ICD-10-CM

## 2020-07-07 DIAGNOSIS — I10 Essential (primary) hypertension: Secondary | ICD-10-CM

## 2020-07-07 DIAGNOSIS — H6991 Unspecified Eustachian tube disorder, right ear: Secondary | ICD-10-CM | POA: Insufficient documentation

## 2020-07-07 DIAGNOSIS — E7800 Pure hypercholesterolemia, unspecified: Secondary | ICD-10-CM

## 2020-07-07 NOTE — Patient Instructions (Signed)
Ok to stop the amlodipine 10 mg  Ok to restart the olmesartan at HALF pill (20 mg) for 1 wk  If you feel ok and the BP is still over 130/80 for the most part, it would be ok to increase the olmesartan to 40 mg per day  Please continue to monitor your BP at home for the next week then and let us know if any dizziness, weakness or other unusual symptoms, and if the BP tends to be too low, such as less as less than 110 for the top number  After 2 weeks please restart the rosuvastatin and watch for any symptoms (which should happen, but just watch)  After total 6 wks, please go to the LAB at the [facility] site for repeat cholesterol and lab testing  Please continue all other medications as before, and refills have been done if requested.  Please have the pharmacy call with any other refills you may need.  Please continue your efforts at being more active, low cholesterol diet, and weight control.  Please keep your appointments with your specialists as you may have planned  Please make an Appointment to return in 6 months, or sooner if needed, if you do not already have an appt

## 2020-07-07 NOTE — Progress Notes (Signed)
Patient ID: Debbie Graves, female   DOB: 1971-01-28, 50 y.o.   MRN: [id number]        Chief Complaint: leg swelling, htn, hld, low vit d, allergies       HPI:  Debbie Graves is a 50 y.o. female here with c/o feet and ankle swelling on amlodipine 10, and for some reason though it might be related to her statin so has stopped that; Pt denies chest pain, increased sob or doe, wheezing, orthopnea, PND, palpitations, dizziness or syncope.   Pt denies polydipsia, polyuria, or new focal neuro s/s.   Pt denies fever, wt loss, night sweats, loss of appetite, or other constitutional symptoms  Does have several wks ongoing nasal allergy symptoms with clearish congestion, itch and sneezing, without fever, pain, ST, cough, swelling or wheezing.  Not taking Vit d.  Does also have some muffled hearing right ear.         Wt Readings from Last 3 Encounters:  07/07/20 184 lb 3.2 oz (83.6 kg)  04/01/20 189 lb (85.7 kg)  01/19/20 192 lb (87.1 kg)   BP Readings from Last 3 Encounters:  07/07/20 130/80  04/01/20 (!) 142/110  01/19/20 130/90         Past Medical History:  Diagnosis Date   COMMON MIGRAINE    Essential hypertension, benign    NEURITIS, OPTIC NOS    Rheumatoid arthritis(714.0)    History reviewed. No pertinent surgical history.  reports that she has never smoked. She has never used smokeless tobacco. She reports current alcohol use. She reports that she does not use drugs. family history includes Breast cancer in an other family member; Coronary artery disease in an other family member; Heart disease in her daughter; Hyperlipidemia in an other family member; Hypertension in an other family member; Lung cancer in an other family member; Sarcoidosis in her mother and another family member; Scleroderma in her father and another family member. Allergies  Allergen Reactions   Dimetapp Decongestant [Pseudoephedrine]     Reaction unknown   Pseudoephedrine-Dm     REACTION: not sure   Current  Outpatient Medications on File Prior to Visit  Medication Sig Dispense Refill   escitalopram (LEXAPRO) 10 MG tablet TAKE 1 TABLET BY MOUTH EVERY DAY 90 tablet 3   levonorgestrel (MIRENA) 20 MCG/24HR IUD 1 each by Intrauterine route once.     olmesartan (BENICAR) 40 MG tablet Take 1 tablet (40 mg total) by mouth daily. 90 tablet 3   rosuvastatin (CRESTOR) 20 MG tablet Take 1 tablet (20 mg total) by mouth daily. 90 tablet 3   XELJANZ XR 11 MG TB24 Take 1 tablet by mouth daily.     No current facility-administered medications on file prior to visit.        ROS:  All others reviewed and negative.  Objective        PE:  BP 130/80 (BP Location: Left Arm, Patient Position: Sitting, Cuff Size: Normal)   Pulse 79   Temp 98.6 F (37 C) (Oral)   Ht 5' 3.5" (1.613 m)   Wt 184 lb 3.2 oz (83.6 kg)   SpO2 98%   BMI 32.12 kg/m                 Constitutional: Pt appears in NAD               HENT: Head: NCAT.                Right Ear:  External ear normal.                 Left Ear: External ear normal.                Eyes: . Pupils are equal, round, and reactive to light. Conjunctivae and EOM are normal; Bilat tm's with mild erythema.  Max sinus areas non tender.  Pharynx with mild erythema, no exudate               Nose: without d/c or deformity               Neck: Neck supple. Gross normal ROM               Cardiovascular: Normal rate and regular rhythm.                 Pulmonary/Chest: Effort normal and breath sounds without rales or wheezing.                Abd:  Soft, NT, ND, + BS, no organomegaly               Neurological: Pt is alert. At baseline orientation, motor grossly intact               Skin: Skin is warm. No rashes, no other new lesions, LE edema - trace pedal               Psychiatric: Pt behavior is normal without agitation   Micro: none  Cardiac tracings I have personally interpreted today:  none  Pertinent Radiological findings (summarize): none   Lab Results  Component  Value Date   WBC 7.0 01/19/2020   HGB 14.1 01/19/2020   HCT 43.8 01/19/2020   PLT 277.0 01/19/2020   GLUCOSE 83 01/19/2020   CHOL 220 (H) 01/19/2020   TRIG 93.0 01/19/2020   HDL 61.90 01/19/2020   LDLCALC 140 (H) 01/19/2020   ALT 10 01/19/2020   AST 18 01/19/2020   NA 138 01/19/2020   K 4.0 01/19/2020   CL 103 01/19/2020   CREATININE 0.87 01/19/2020   BUN 11 01/19/2020   CO2 30 01/19/2020   TSH 2.00 01/19/2020   Assessment/Plan:  Debbie Graves is a 50 y.o. Black or African American [2] female with  has a past medical history of COMMON MIGRAINE, Essential hypertension, benign, NEURITIS, OPTIC NOS, and Rheumatoid arthritis(714.0).  Vitamin D deficiency Last vitamin D Lab Results  Component Value Date   VD25OH 26.77 (L) 01/19/2020   Low, to start oral replacement   Allergic rhinitis Ok for otc allegra rpn  Hypertension, uncontrolled To dc amlodipine 10 mg which is likely cause pedal edema, ok to restart olmesartan 20 mg per day, but ok for 40 mg after 1 wk if BP persistently elevated  HLD (hyperlipidemia) D/w pt, doubt statin is cause of swelling but not clear she is convinced; ok for restart statin if no swelling recurs off the amlidipine  Acute dysfunction of right eustachian tube Also for mucinex bid prn  Followup: Return in about 6 months (around 01/06/2021).  Cathlean Cower, MD 07/10/2020 8:17 PM [facility]

## 2020-07-10 ENCOUNTER — Encounter: Payer: Self-pay | Admitting: Internal Medicine

## 2020-07-10 NOTE — Assessment & Plan Note (Signed)
Ok for [employer] rpn

## 2020-07-10 NOTE — Assessment & Plan Note (Signed)
Last vitamin D Lab Results  Component Value Date   VD25OH 26.77 (L) 01/19/2020   Low, to start oral replacement

## 2020-07-10 NOTE — Assessment & Plan Note (Signed)
To dc amlodipine 10 mg which is likely cause pedal edema, ok to restart olmesartan 20 mg per day, but ok for 40 mg after 1 wk if BP persistently elevated

## 2020-07-10 NOTE — Assessment & Plan Note (Signed)
D/w pt, doubt statin is cause of swelling but not clear she is convinced; ok for restart statin if no swelling recurs off the amlidipine

## 2020-07-10 NOTE — Assessment & Plan Note (Signed)
Also for mucinex bid prn

## 2020-07-10 NOTE — Addendum Note (Signed)
Addended by: Biagio Borg on: 07/10/2020 08:19 PM   Modules accepted: Orders

## 2020-08-30 ENCOUNTER — Telehealth: Payer: Self-pay | Admitting: Internal Medicine

## 2020-08-30 ENCOUNTER — Ambulatory Visit (INDEPENDENT_AMBULATORY_CARE_PROVIDER_SITE_OTHER): Payer: BC Managed Care – PPO | Admitting: Family

## 2020-08-30 ENCOUNTER — Other Ambulatory Visit: Payer: Self-pay

## 2020-08-30 ENCOUNTER — Encounter: Payer: Self-pay | Admitting: Family

## 2020-08-30 VITALS — BP 148/100 | HR 76 | Temp 98.0°F | Ht 63.0 in | Wt 186.6 lb

## 2020-08-30 DIAGNOSIS — M25512 Pain in left shoulder: Secondary | ICD-10-CM | POA: Diagnosis not present

## 2020-08-30 DIAGNOSIS — M545 Low back pain, unspecified: Secondary | ICD-10-CM | POA: Diagnosis not present

## 2020-08-30 DIAGNOSIS — I10 Essential (primary) hypertension: Secondary | ICD-10-CM

## 2020-08-30 MED ORDER — CYCLOBENZAPRINE HCL 5 MG PO TABS: 5.0000 mg | ORAL_TABLET | Freq: Three times a day (TID) | ORAL | 0 refills | Status: DC | PRN
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
## 2020-08-30 NOTE — Telephone Encounter (Signed)
Noted we will address concern at appointment today.
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
## 2020-09-05 ENCOUNTER — Other Ambulatory Visit: Payer: Self-pay

## 2020-09-05 ENCOUNTER — Ambulatory Visit (INDEPENDENT_AMBULATORY_CARE_PROVIDER_SITE_OTHER): Payer: BC Managed Care – PPO | Admitting: Internal Medicine

## 2020-09-05 ENCOUNTER — Encounter: Payer: Self-pay | Admitting: Internal Medicine

## 2020-09-05 ENCOUNTER — Ambulatory Visit (INDEPENDENT_AMBULATORY_CARE_PROVIDER_SITE_OTHER): Payer: BC Managed Care – PPO

## 2020-09-05 VITALS — BP 170/110 | HR 89 | Temp 97.8°F | Ht 63.0 in | Wt 187.0 lb

## 2020-09-05 DIAGNOSIS — I10 Essential (primary) hypertension: Secondary | ICD-10-CM | POA: Diagnosis not present

## 2020-09-05 DIAGNOSIS — F419 Anxiety disorder, unspecified: Secondary | ICD-10-CM

## 2020-09-05 DIAGNOSIS — M549 Dorsalgia, unspecified: Secondary | ICD-10-CM

## 2020-09-05 DIAGNOSIS — R079 Chest pain, unspecified: Secondary | ICD-10-CM

## 2020-09-05 MED ORDER — ESCITALOPRAM OXALATE 10 MG PO TABS
10.0000 mg | ORAL_TABLET | Freq: Every day | ORAL | 3 refills | Status: DC
Start: 2020-09-05 — End: 2021-11-19

## 2020-09-05 MED ORDER — HYDROCHLOROTHIAZIDE 12.5 MG PO CAPS: 12.5000 mg | ORAL_CAPSULE | Freq: Every day | ORAL | 3 refills | Status: DC

## 2020-09-05 NOTE — Patient Instructions (Addendum)
Please take all new medication as prescribed - the HCT 12.5 mg per day (mild fluid pill for blood pressure)  After 3-5 days please check the BP at home once per day for 10 days and call with the average BP   Please continue all other medications as before, including to restart the lexapro 10 mg  You will be contacted regarding the referral for: Counseling  Please have the pharmacy call with any other refills you may need.  Please continue your efforts at being more active, low cholesterol diet, and weight control.  Please keep your appointments with your specialists as you may have planned  Please go to the XRAY Department in the first floor for the x-ray testing  You will be contacted by phone if any changes need to be made immediately.  Otherwise, you will receive a letter about your results with an explanation, but please check with MyChart first.  Please remember to sign up for MyChart if you have not done so, as this will be important to you in the future with finding out test results, communicating by private email, and scheduling acute appointments online when needed.  Please make an Appointment to return in 1 months, or sooner if needed

## 2020-09-05 NOTE — Assessment & Plan Note (Signed)
Continues uncontrolled BP Readings from Last 3 Encounters:  09/05/20 (!) 170/110  08/30/20 (!) 148/100  07/07/20 130/80   Will add hct 12.5 qd

## 2020-09-05 NOTE — Assessment & Plan Note (Signed)
Uncontrolled, for restart lexapro 10 qd, refer counseling

## 2020-09-05 NOTE — Assessment & Plan Note (Signed)
Transient but concerning, for cxr
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
## 2020-09-06 ENCOUNTER — Encounter: Payer: Self-pay | Admitting: Internal Medicine

## 2020-09-14 ENCOUNTER — Ambulatory Visit (INDEPENDENT_AMBULATORY_CARE_PROVIDER_SITE_OTHER): Payer: BC Managed Care – PPO | Admitting: Psychology

## 2020-09-14 DIAGNOSIS — M0589 Other rheumatoid arthritis with rheumatoid factor of multiple sites: Secondary | ICD-10-CM | POA: Diagnosis not present

## 2020-09-14 DIAGNOSIS — F4322 Adjustment disorder with anxiety: Secondary | ICD-10-CM

## 2020-09-14 DIAGNOSIS — M2550 Pain in unspecified joint: Secondary | ICD-10-CM | POA: Diagnosis not present

## 2020-09-14 DIAGNOSIS — L409 Psoriasis, unspecified: Secondary | ICD-10-CM | POA: Diagnosis not present

## 2020-09-14 DIAGNOSIS — M1990 Unspecified osteoarthritis, unspecified site: Secondary | ICD-10-CM | POA: Diagnosis not present

## 2020-09-19 ENCOUNTER — Ambulatory Visit (INDEPENDENT_AMBULATORY_CARE_PROVIDER_SITE_OTHER): Payer: BC Managed Care – PPO | Admitting: Psychology

## 2020-09-19 DIAGNOSIS — F4322 Adjustment disorder with anxiety: Secondary | ICD-10-CM

## 2020-09-27 ENCOUNTER — Ambulatory Visit (INDEPENDENT_AMBULATORY_CARE_PROVIDER_SITE_OTHER): Payer: BC Managed Care – PPO | Admitting: Psychology

## 2020-09-27 DIAGNOSIS — F4322 Adjustment disorder with anxiety: Secondary | ICD-10-CM

## 2020-10-10 ENCOUNTER — Ambulatory Visit: Payer: BC Managed Care – PPO | Admitting: Internal Medicine

## 2020-10-14 ENCOUNTER — Ambulatory Visit: Payer: BC Managed Care – PPO | Admitting: Internal Medicine

## 2020-10-14 ENCOUNTER — Other Ambulatory Visit: Payer: Self-pay

## 2020-11-02 ENCOUNTER — Ambulatory Visit (INDEPENDENT_AMBULATORY_CARE_PROVIDER_SITE_OTHER): Payer: BC Managed Care – PPO | Admitting: Internal Medicine

## 2020-11-02 ENCOUNTER — Other Ambulatory Visit: Payer: Self-pay

## 2020-11-02 ENCOUNTER — Encounter: Payer: Self-pay | Admitting: Internal Medicine

## 2020-11-02 VITALS — BP 148/88 | HR 69 | Temp 98.7°F | Ht 63.0 in | Wt 187.0 lb

## 2020-11-02 DIAGNOSIS — F419 Anxiety disorder, unspecified: Secondary | ICD-10-CM | POA: Diagnosis not present

## 2020-11-02 DIAGNOSIS — I10 Essential (primary) hypertension: Secondary | ICD-10-CM | POA: Diagnosis not present

## 2020-11-02 DIAGNOSIS — E538 Deficiency of other specified B group vitamins: Secondary | ICD-10-CM

## 2020-11-02 DIAGNOSIS — E7800 Pure hypercholesterolemia, unspecified: Secondary | ICD-10-CM | POA: Diagnosis not present

## 2020-11-02 DIAGNOSIS — E559 Vitamin D deficiency, unspecified: Secondary | ICD-10-CM

## 2020-11-02 NOTE — Patient Instructions (Signed)
Please continue all other medications as before, and refills have been done if requested.  Please have the pharmacy call with any other refills you may need.  Please continue your efforts at being more active, low cholesterol diet, and weight control.  Please keep your appointments with your specialists as you may have planned  Please make an Appointment to return in 3 months, or sooner if needed, also with Lab Appointment for testing done 3-5 days before at the [facility] (so this is for TWO appointments - please see the scheduling desk as you leave)  Due to the ongoing Covid 19 pandemic, our lab now requires an appointment for any labs done at our office.  If you need labs done and do not have an appointment, please call our office ahead of time to schedule before presenting to the lab for your testing.

## 2020-11-02 NOTE — Progress Notes (Signed)
Patient ID: Debbie Graves, female   DOB: Oct 19, 1970, 50 y.o.   MRN: [id number]        Chief Complaint: follow up HTN, anxiety, hld, low vit d       HPI:  Debbie Graves is a 50 y.o. female here overall doing ok,  Pt denies chest pain, increased sob or doe, wheezing, orthopnea, PND, increased LE swelling, palpitations, dizziness or syncope.   Pt denies polydipsia, polyuria, or new focal neuro s/s.    Pt denies fever, wt loss, night sweats, loss of appetite, or other constitutional symptoms  Denies worsening depressive symptoms, suicidal ideation, or panic; has ongoing anxiety, overall feels much improved in the last 2 wks with lexapro and counseling session x 3 so far.  Not taking Vit D.  Not checking BP at home very often.        Wt Readings from Last 3 Encounters:  11/02/20 187 lb (84.8 kg)  09/05/20 187 lb (84.8 kg)  08/30/20 186 lb 9.6 oz (84.6 kg)   BP Readings from Last 3 Encounters:  11/02/20 (!) 148/88  09/05/20 (!) 170/110  08/30/20 (!) 148/100         Past Medical History:  Diagnosis Date   COMMON MIGRAINE    Essential hypertension, benign    NEURITIS, OPTIC NOS    Rheumatoid arthritis(714.0)    History reviewed. No pertinent surgical history.  reports that she has never smoked. She has never used smokeless tobacco. She reports current alcohol use. She reports that she does not use drugs. family history includes Breast cancer in an other family member; Coronary artery disease in an other family member; Heart disease in her daughter; Hyperlipidemia in an other family member; Hypertension in an other family member; Lung cancer in an other family member; Sarcoidosis in her mother and another family member; Scleroderma in her father and another family member. Allergies  Allergen Reactions   Dimetapp Decongestant [Pseudoephedrine]     Reaction unknown   Pseudoephedrine-Dm     REACTION: not sure   Current Outpatient Medications on File Prior to Visit  Medication Sig  Dispense Refill   cyclobenzaprine (FLEXERIL) 5 MG tablet Take 1 tablet (5 mg total) by mouth 3 (three) times daily as needed for muscle spasms. 30 tablet 0   escitalopram (LEXAPRO) 10 MG tablet Take 1 tablet (10 mg total) by mouth daily. 90 tablet 3   hydrochlorothiazide (MICROZIDE) 12.5 MG capsule Take 1 capsule (12.5 mg total) by mouth daily. 90 capsule 3   levonorgestrel (MIRENA) 20 MCG/24HR IUD 1 each by Intrauterine route once.     olmesartan (BENICAR) 40 MG tablet Take 1 tablet (40 mg total) by mouth daily. 90 tablet 3   XELJANZ XR 11 MG TB24 Take 1 tablet by mouth daily.     No current facility-administered medications on file prior to visit.        ROS:  All others reviewed and negative.  Objective        PE:  BP (!) 148/88 (BP Location: Right Arm, Patient Position: Sitting, Cuff Size: Large)   Pulse 69   Temp 98.7 F (37.1 C) (Oral)   Ht 5\' 3"  (1.6 m)   Wt 187 lb (84.8 kg)   SpO2 97%   BMI 33.13 kg/m                 Constitutional: Pt appears in NAD               HENT: Head:  NCAT.                Right Ear: External ear normal.                 Left Ear: External ear normal.                Eyes: . Pupils are equal, round, and reactive to light. Conjunctivae and EOM are normal               Nose: without d/c or deformity               Neck: Neck supple. Gross normal ROM               Cardiovascular: Normal rate and regular rhythm.                 Pulmonary/Chest: Effort normal and breath sounds without rales or wheezing.                Abd:  Soft, NT, ND, + BS, no organomegaly               Neurological: Pt is alert. At baseline orientation, motor grossly intact               Skin: Skin is warm. No rashes, no other new lesions, LE edema - none               Psychiatric: Pt behavior is normal without agitation   Micro: none  Cardiac tracings I have personally interpreted today:  none  Pertinent Radiological findings (summarize): none   Lab Results  Component Value  Date   WBC 7.0 01/19/2020   HGB 14.1 01/19/2020   HCT 43.8 01/19/2020   PLT 277.0 01/19/2020   GLUCOSE 83 01/19/2020   CHOL 220 (H) 01/19/2020   TRIG 93.0 01/19/2020   HDL 61.90 01/19/2020   LDLCALC 140 (H) 01/19/2020   ALT 10 01/19/2020   AST 18 01/19/2020   NA 138 01/19/2020   K 4.0 01/19/2020   CL 103 01/19/2020   CREATININE 0.87 01/19/2020   BUN 11 01/19/2020   CO2 30 01/19/2020   TSH 2.00 01/19/2020   Assessment/Plan:  Debbie Graves is a 50 y.o. Black or African American [2] female with  has a past medical history of COMMON MIGRAINE, Essential hypertension, benign, NEURITIS, OPTIC NOS, and Rheumatoid arthritis(714.0).  Vitamin D deficiency Last vitamin D Lab Results  Component Value Date   VD25OH 26.77 (L) 01/19/2020   Low, to start oral replacement   HLD (hyperlipidemia) Lab Results  Component Value Date   LDLCALC 140 (H) 01/19/2020   Uncontrolled, goal ldl < 100, pt to continue current low chol diet, declines statin for now   Hypertension, uncontrolled BP Readings from Last 3 Encounters:  11/02/20 (!) 148/88  09/05/20 (!) 170/110  08/30/20 (!) 148/100   Uncontrolled, pt to continue medical treatment benicar, hct, - declines change today   Anxiety Much improved recenlty, to continue counseling and lexapro asd,  to f/u any worsening symptoms or concerns  Followup: Return in about 3 months (around 02/02/2021).  Cathlean Cower, MD 11/06/2020 5:32 PM [facility]

## 2020-11-06 ENCOUNTER — Encounter: Payer: Self-pay | Admitting: Internal Medicine

## 2020-11-06 NOTE — Assessment & Plan Note (Signed)
Much improved recenlty, to continue counseling and lexapro asd,  to f/u any worsening symptoms or concerns

## 2020-11-06 NOTE — Assessment & Plan Note (Signed)
BP Readings from Last 3 Encounters:  11/02/20 (!) 148/88  09/05/20 (!) 170/110  08/30/20 (!) 148/100   Uncontrolled, pt to continue medical treatment benicar, hct, - declines change today

## 2020-11-06 NOTE — Assessment & Plan Note (Signed)
Lab Results  Component Value Date   LDLCALC 140 (H) 01/19/2020   Uncontrolled, goal ldl < 100, pt to continue current low chol diet, declines statin for now

## 2020-11-06 NOTE — Assessment & Plan Note (Signed)
Last vitamin D Lab Results  Component Value Date   VD25OH 26.77 (L) 01/19/2020   Low, to start oral replacement

## 2020-12-13 DIAGNOSIS — Z20822 Contact with and (suspected) exposure to covid-19: Secondary | ICD-10-CM | POA: Diagnosis not present

## 2020-12-13 DIAGNOSIS — R051 Acute cough: Secondary | ICD-10-CM | POA: Diagnosis not present

## 2020-12-13 DIAGNOSIS — J018 Other acute sinusitis: Secondary | ICD-10-CM | POA: Diagnosis not present

## 2020-12-16 DIAGNOSIS — R051 Acute cough: Secondary | ICD-10-CM | POA: Diagnosis not present

## 2021-02-02 ENCOUNTER — Ambulatory Visit (INDEPENDENT_AMBULATORY_CARE_PROVIDER_SITE_OTHER): Payer: BC Managed Care – PPO | Admitting: Internal Medicine

## 2021-02-02 ENCOUNTER — Other Ambulatory Visit: Payer: Self-pay

## 2021-02-02 ENCOUNTER — Encounter: Payer: Self-pay | Admitting: Internal Medicine

## 2021-02-02 VITALS — BP 118/70 | HR 77 | Temp 98.1°F | Ht 63.0 in | Wt 185.0 lb

## 2021-02-02 DIAGNOSIS — E559 Vitamin D deficiency, unspecified: Secondary | ICD-10-CM | POA: Diagnosis not present

## 2021-02-02 DIAGNOSIS — E7800 Pure hypercholesterolemia, unspecified: Secondary | ICD-10-CM | POA: Diagnosis not present

## 2021-02-02 DIAGNOSIS — Z1211 Encounter for screening for malignant neoplasm of colon: Secondary | ICD-10-CM | POA: Diagnosis not present

## 2021-02-02 DIAGNOSIS — I10 Essential (primary) hypertension: Secondary | ICD-10-CM

## 2021-02-02 DIAGNOSIS — E538 Deficiency of other specified B group vitamins: Secondary | ICD-10-CM | POA: Diagnosis not present

## 2021-02-02 DIAGNOSIS — F419 Anxiety disorder, unspecified: Secondary | ICD-10-CM

## 2021-02-02 DIAGNOSIS — Z0001 Encounter for general adult medical examination with abnormal findings: Secondary | ICD-10-CM

## 2021-02-02 LAB — HEPATIC FUNCTION PANEL
ALT: 11 U/L (ref 0–35)
AST: 20 U/L (ref 0–37)
Albumin: 4 g/dL (ref 3.5–5.2)
Alkaline Phosphatase: 56 U/L (ref 39–117)
Bilirubin, Direct: 0.1 mg/dL (ref 0.0–0.3)
Total Bilirubin: 0.3 mg/dL (ref 0.2–1.2)
Total Protein: 6.7 g/dL (ref 6.0–8.3)

## 2021-02-02 LAB — BASIC METABOLIC PANEL
BUN: 18 mg/dL (ref 6–23)
CO2: 30 mEq/L (ref 19–32)
Calcium: 8.9 mg/dL (ref 8.4–10.5)
Chloride: 104 mEq/L (ref 96–112)
Creatinine, Ser: 0.92 mg/dL (ref 0.40–1.20)
GFR: 73.73 mL/min (ref >60.00)
Glucose, Bld: 82 mg/dL (ref 70–99)
Potassium: 3.9 mEq/L (ref 3.5–5.1)
Sodium: 139 mEq/L (ref 135–145)

## 2021-02-02 LAB — CBC WITH DIFFERENTIAL/PLATELET
Basophils Absolute: 0 10*3/uL (ref 0.0–0.1)
Basophils Relative: 0.6 % (ref 0.0–3.0)
Eosinophils Absolute: 0.1 10*3/uL (ref 0.0–0.7)
Eosinophils Relative: 1 % (ref 0.0–5.0)
HCT: 41.2 % (ref 36.0–46.0)
Hemoglobin: 13.2 g/dL (ref 12.0–15.0)
Lymphocytes Relative: 17.9 % (ref 12.0–46.0)
Lymphs Abs: 1 10*3/uL (ref 0.7–4.0)
MCHC: 32 g/dL (ref 30.0–36.0)
MCV: 90.5 fl (ref 78.0–100.0)
Monocytes Absolute: 0.5 10*3/uL (ref 0.1–1.0)
Monocytes Relative: 8.6 % (ref 3.0–12.0)
Neutro Abs: 4.1 10*3/uL (ref 1.4–7.7)
Neutrophils Relative %: 71.9 % (ref 43.0–77.0)
Platelets: 302 10*3/uL (ref 150.0–400.0)
RBC: 4.55 Mil/uL (ref 3.87–5.11)
RDW: 13.2 % (ref 11.5–15.5)
WBC: 5.7 10*3/uL (ref 4.0–10.5)

## 2021-02-02 LAB — LIPID PANEL
Cholesterol: 234 mg/dL — ABNORMAL HIGH (ref 0–200)
HDL: 55.4 mg/dL (ref >39.00)
LDL Cholesterol: 163 mg/dL — ABNORMAL HIGH (ref 0–99)
NonHDL: 178.32
Total CHOL/HDL Ratio: 4
Triglycerides: 78 mg/dL (ref 0.0–149.0)
VLDL: 15.6 mg/dL (ref 0.0–40.0)

## 2021-02-02 LAB — VITAMIN D 25 HYDROXY (VIT D DEFICIENCY, FRACTURES): VITD: 18.51 ng/mL — ABNORMAL LOW (ref 30.00–100.00)

## 2021-02-02 LAB — TSH: TSH: 2.16 u[IU]/mL (ref 0.35–5.50)

## 2021-02-02 LAB — VITAMIN B12: Vitamin B-12: 308 pg/mL (ref 211–911)

## 2021-02-02 NOTE — Progress Notes (Signed)
Patient ID: Debbie Graves, female   DOB: 01-28-1971, 51 y.o.   MRN: [id number]         Chief Complaint:: wellness exam and Follow-up  Hld, low vit d, anxiety, htn       HPI:  Debbie Graves is a 51 y.o. female here for wellness exam; due for colonoscopy, o/w up to date                        Also not taking vit d.  Trying to work on lower chol diet, does not want to start statin for now.  Pt denies chest pain, increased sob or doe, wheezing, orthopnea, PND, increased LE swelling, palpitations, dizziness or syncope.   Pt denies polydipsia, polyuria, or new focal neuro s/s.  Denies worsening depressive symptoms, suicidal ideation, or panic; has ongoing anxiety.   Pt denies fever, wt loss, night sweats, loss of appetite, or other constitutional symptoms   Wt Readings from Last 3 Encounters:  02/02/21 185 lb (83.9 kg)  11/02/20 187 lb (84.8 kg)  09/05/20 187 lb (84.8 kg)   BP Readings from Last 3 Encounters:  02/02/21 118/70  11/02/20 (!) 148/88  09/05/20 (!) 170/110   Immunization History  Administered Date(s) Administered   Influenza,inj,Quad PF,6+ Mos 01/15/2019   Influenza-Unspecified 01/24/2021   PFIZER(Purple Top)SARS-COV-2 Vaccination 04/03/2019, 04/24/2019, 01/19/2020, 01/30/2021   Tdap 01/15/2019   Health Maintenance Due  Topic Date Due   COLONOSCOPY (Pts 45-20yrs Insurance coverage will need to be confirmed)  Never done      Past Medical History:  Diagnosis Date   COMMON MIGRAINE    Essential hypertension, benign    NEURITIS, OPTIC NOS    Rheumatoid arthritis(714.0)    History reviewed. No pertinent surgical history.  reports that she has never smoked. She has never used smokeless tobacco. She reports current alcohol use. She reports that she does not use drugs. family history includes Breast cancer in an other family member; Coronary artery disease in an other family member; Heart disease in her daughter; Hyperlipidemia in an other family member; Hypertension in  an other family member; Lung cancer in an other family member; Sarcoidosis in her mother and another family member; Scleroderma in her father and another family member. Allergies  Allergen Reactions   Dimetapp Decongestant [Pseudoephedrine]     Reaction unknown   Pseudoephedrine-Dm     REACTION: not sure   Current Outpatient Medications on File Prior to Visit  Medication Sig Dispense Refill   escitalopram (LEXAPRO) 10 MG tablet Take 1 tablet (10 mg total) by mouth daily. 90 tablet 3   hydrochlorothiazide (MICROZIDE) 12.5 MG capsule Take 1 capsule (12.5 mg total) by mouth daily. 90 capsule 3   levonorgestrel (MIRENA) 20 MCG/24HR IUD 1 each by Intrauterine route once.     olmesartan (BENICAR) 40 MG tablet Take 1 tablet (40 mg total) by mouth daily. 90 tablet 3   XELJANZ XR 11 MG TB24 Take 1 tablet by mouth daily.     cyclobenzaprine (FLEXERIL) 5 MG tablet Take 1 tablet (5 mg total) by mouth 3 (three) times daily as needed for muscle spasms. (Patient not taking: Reported on 02/02/2021) 30 tablet 0   No current facility-administered medications on file prior to visit.        ROS:  All others reviewed and negative.  Objective        PE:  BP 118/70 (BP Location: Right Arm, Patient Position: Sitting, Cuff Size: Large)  Pulse 77    Temp 98.1 F (36.7 C) (Oral)    Ht 5\' 3"  (1.6 m)    Wt 185 lb (83.9 kg)    SpO2 98%    BMI 32.77 kg/m                 Constitutional: Pt appears in NAD               HENT: Head: NCAT.                Right Ear: External ear normal.                 Left Ear: External ear normal.                Eyes: . Pupils are equal, round, and reactive to light. Conjunctivae and EOM are normal               Nose: without d/c or deformity               Neck: Neck supple. Gross normal ROM               Cardiovascular: Normal rate and regular rhythm.                 Pulmonary/Chest: Effort normal and breath sounds without rales or wheezing.                Abd:  Soft, NT, ND, +  BS, no organomegaly               Neurological: Pt is alert. At baseline orientation, motor grossly intact               Skin: Skin is warm. No rashes, no other new lesions, LE edema - none               Psychiatric: Pt behavior is normal without agitation , mild nervous  Micro: none  Cardiac tracings I have personally interpreted today:  none  Pertinent Radiological findings (summarize): none   Lab Results  Component Value Date   WBC 5.7 02/02/2021   HGB 13.2 02/02/2021   HCT 41.2 02/02/2021   PLT 302.0 02/02/2021   GLUCOSE 82 02/02/2021   CHOL 234 (H) 02/02/2021   TRIG 78.0 02/02/2021   HDL 55.40 02/02/2021   LDLCALC 163 (H) 02/02/2021   ALT 11 02/02/2021   AST 20 02/02/2021   NA 139 02/02/2021   K 3.9 02/02/2021   CL 104 02/02/2021   CREATININE 0.92 02/02/2021   BUN 18 02/02/2021   CO2 30 02/02/2021   TSH 2.16 02/02/2021   Assessment/Plan:  Debbie Graves is a 51 y.o. Black or African American [2] female with  has a past medical history of COMMON MIGRAINE, Essential hypertension, benign, NEURITIS, OPTIC NOS, and Rheumatoid arthritis(714.0).  Vitamin D deficiency Last vitamin D Lab Results  Component Value Date   VD25OH 26.77 (L) 01/19/2020   Low, to start oral replacement   HLD (hyperlipidemia) Lab Results  Component Value Date   LDLCALC 140 (H) 01/19/2020   Uncontrolled, goal ldl < 100, pt to continue current low chol diet, declines statin for now   Encounter for well adult exam with abnormal findings Age and sex appropriate education and counseling updated with regular exercise and diet Referrals for preventative services - for colnoscopy Immunizations addressed - none needed Smoking counseling  - none needed Evidence for depression or other mood disorder - stable anxiety Most  recent labs reviewed. I have personally reviewed and have noted: 1) the patient's medical and social history 2) The patient's current medications and supplements 3) The  patient's height, weight, and BMI have been recorded in the chart   Anxiety Chronic stable, cont to follow  Hypertension, uncontrolled BP Readings from Last 3 Encounters:  02/02/21 118/70  11/02/20 (!) 148/88  09/05/20 (!) 170/110   Stable, pt to continue medical treatment benicar, hct  Followup: Return in about 1 year (around 02/02/2022).  Cathlean Cower, MD 02/05/2021 7:43 PM [facility]

## 2021-02-02 NOTE — Progress Notes (Signed)
Patient ID: Debbie Graves, female   DOB: [birth year], 51 y.o.   MRN: [id number]

## 2021-02-02 NOTE — Patient Instructions (Signed)
You will be contacted regarding the referral for: colonoscopy  Please take OTC Vitamin D3 at 2000 units per day, indefinitely  Please continue all other medications as before, and refills have been done if requested.  Please have the pharmacy call with any other refills you may need.  Please continue your efforts at being more active, low cholesterol diet, and weight control.  You are otherwise up to date with prevention measures today.  Please keep your appointments with your specialists as you may have planned  Please go to the LAB at the blood drawing area for the tests to be done  You will be contacted by phone if any changes need to be made immediately.  Otherwise, you will receive a letter about your results with an explanation, but please check with MyChart first.  Please remember to sign up for MyChart if you have not done so, as this will be important to you in the future with finding out test results, communicating by private email, and scheduling acute appointments online when needed.

## 2021-02-02 NOTE — Assessment & Plan Note (Signed)
Last vitamin D Lab Results  Component Value Date   VD25OH 26.77 (L) 01/19/2020   Low, to start oral replacement

## 2021-02-02 NOTE — Assessment & Plan Note (Signed)
Lab Results  Component Value Date   LDLCALC 140 (H) 01/19/2020   Uncontrolled, goal ldl < 100, pt to continue current low chol diet, declines statin for now

## 2021-02-03 ENCOUNTER — Encounter: Payer: Self-pay | Admitting: Internal Medicine

## 2021-02-05 ENCOUNTER — Encounter: Payer: Self-pay | Admitting: Internal Medicine

## 2021-02-05 NOTE — Assessment & Plan Note (Signed)
Chronic stable, cont to follow

## 2021-02-05 NOTE — Assessment & Plan Note (Signed)
Age and sex appropriate education and counseling updated with regular exercise and diet Referrals for preventative services - for colnoscopy Immunizations addressed - none needed Smoking counseling  - none needed Evidence for depression or other mood disorder - stable anxiety Most recent labs reviewed. I have personally reviewed and have noted: 1) the patient's medical and social history 2) The patient's current medications and supplements 3) The patient's height, weight, and BMI have been recorded in the chart

## 2021-02-05 NOTE — Assessment & Plan Note (Signed)
BP Readings from Last 3 Encounters:  02/02/21 118/70  11/02/20 (!) 148/88  09/05/20 (!) 170/110   Stable, pt to continue medical treatment benicar, hct

## 2021-02-24 DIAGNOSIS — L680 Hirsutism: Secondary | ICD-10-CM | POA: Insufficient documentation

## 2021-03-06 ENCOUNTER — Encounter: Payer: Self-pay | Admitting: Internal Medicine

## 2021-04-14 ENCOUNTER — Other Ambulatory Visit: Payer: Self-pay

## 2021-04-14 ENCOUNTER — Ambulatory Visit (AMBULATORY_SURGERY_CENTER): Payer: BC Managed Care – PPO

## 2021-04-14 VITALS — Ht 64.0 in | Wt 178.0 lb

## 2021-04-14 DIAGNOSIS — Z1211 Encounter for screening for malignant neoplasm of colon: Secondary | ICD-10-CM

## 2021-04-14 NOTE — Progress Notes (Signed)
Denies allergies to eggs or soy products. Denies complication of anesthesia or sedation. Denies use of weight loss medication. Denies use of O2.   Emmi instructions given for colonoscopy.  

## 2021-04-26 ENCOUNTER — Telehealth: Payer: Self-pay | Admitting: Internal Medicine

## 2021-04-26 NOTE — Telephone Encounter (Signed)
Patient called and rescheduled her colonoscopy per appointment desk. New prep instructions sent via pt's Mychart. ?

## 2021-04-26 NOTE — Telephone Encounter (Signed)
Called patient again-no answer. Left a message for the patient to call me back today before 5 pm. ?

## 2021-04-26 NOTE — Telephone Encounter (Signed)
Called patient x2, no answers. Left message for the patient to return my call today.  ?

## 2021-04-28 ENCOUNTER — Encounter: Payer: BC Managed Care – PPO | Admitting: Internal Medicine

## 2021-05-02 ENCOUNTER — Other Ambulatory Visit: Payer: Self-pay | Admitting: Internal Medicine

## 2021-05-02 NOTE — Telephone Encounter (Signed)
Please refill as per office routine med refill policy (all routine meds to be refilled for 3 mo or monthly (per pt preference) up to one year from last visit, then month to month grace period for 3 mo, then further med refills will have to be denied) ? ?

## 2021-06-15 ENCOUNTER — Ambulatory Visit: Payer: BC Managed Care – PPO | Admitting: Internal Medicine

## 2021-06-15 ENCOUNTER — Encounter: Payer: Self-pay | Admitting: Internal Medicine

## 2021-06-15 VITALS — BP 120/70 | HR 78 | Resp 18 | Ht 63.0 in | Wt 185.4 lb

## 2021-06-15 DIAGNOSIS — I10 Essential (primary) hypertension: Secondary | ICD-10-CM

## 2021-06-15 DIAGNOSIS — J309 Allergic rhinitis, unspecified: Secondary | ICD-10-CM

## 2021-06-15 DIAGNOSIS — H903 Sensorineural hearing loss, bilateral: Secondary | ICD-10-CM | POA: Diagnosis not present

## 2021-06-15 DIAGNOSIS — E559 Vitamin D deficiency, unspecified: Secondary | ICD-10-CM

## 2021-06-15 NOTE — Progress Notes (Signed)
Patient ID: Debbie Graves, female   DOB: [birth year], 51 y.o.   MRN: [id number]        Chief Complaint: follow up bilateral hearing loss right > left, allergies, htn, low vit d       HPI:  Debbie Graves is a 51 y.o. female here with c/o 2 wks reduced hearing both ears right more than left; and Does have several wks ongoing nasal allergy symptoms with clearish congestion, itch and sneezing, without fever, pain, ST, cough, swelling or wheezing.   Pt denies fever, wt loss, night sweats, loss of appetite, or other constitutional symptoms  Pt denies chest pain, increased sob or doe, wheezing, orthopnea, PND, increased LE swelling, palpitations, dizziness or syncope. Not taking Vit D       Wt Readings from Last 3 Encounters:  06/15/21 185 lb 6.4 oz (84.1 kg)  04/14/21 178 lb (80.7 kg)  02/02/21 185 lb (83.9 kg)   BP Readings from Last 3 Encounters:  06/15/21 120/70  02/02/21 118/70  11/02/20 (!) 148/88         Past Medical History:  Diagnosis Date   Anxiety    Arthritis    COMMON MIGRAINE    Essential hypertension, benign    NEURITIS, OPTIC NOS    Neuromuscular disorder (HCC)    Rheumatoid arthritis(714.0)    History reviewed. No pertinent surgical history.  reports that she has never smoked. She has never used smokeless tobacco. She reports that she does not currently use alcohol. She reports that she does not use drugs. family history includes Breast cancer in an other family member; Coronary artery disease in an other family member; Heart disease in her daughter; Hyperlipidemia in an other family member; Hypertension in an other family member; Lung cancer in an other family member; Sarcoidosis in her mother and another family member; Scleroderma in her father and another family member. Allergies  Allergen Reactions   Dimetapp Decongestant [Pseudoephedrine]     Reaction unknown   Pseudoephedrine-Dm     REACTION: not sure   Current Outpatient Medications on File Prior to Visit   Medication Sig Dispense Refill   cyclobenzaprine (FLEXERIL) 5 MG tablet Take 1 tablet (5 mg total) by mouth 3 (three) times daily as needed for muscle spasms. 30 tablet 0   escitalopram (LEXAPRO) 10 MG tablet Take 1 tablet (10 mg total) by mouth daily. 90 tablet 3   hydrochlorothiazide (MICROZIDE) 12.5 MG capsule Take 1 capsule (12.5 mg total) by mouth daily. 90 capsule 3   levonorgestrel (MIRENA) 20 MCG/24HR IUD 1 each by Intrauterine route once.     olmesartan (BENICAR) 40 MG tablet TAKE 1 TABLET BY MOUTH EVERY DAY 90 tablet 2   polyethylene glycol powder (GLYCOLAX/MIRALAX) 17 GM/SCOOP powder Take 1 Container by mouth once.     XELJANZ XR 11 MG TB24 Take 1 tablet by mouth daily.     No current facility-administered medications on file prior to visit.        ROS:  All others reviewed and negative.  Objective        PE:  BP 120/70   Pulse 78   Resp 18   Ht '5\' 3"'$  (1.6 m)   Wt 185 lb 6.4 oz (84.1 kg)   SpO2 98%   BMI 32.84 kg/m                 Constitutional: Pt appears in NAD  HENT: Head: NCAT.                Right Ear: External ear normal.                 Left Ear: External ear normal. Bilat tm's with mild erythema.  Max sinus areas non tender.  Pharynx with mild erythema, no exudate                Eyes: . Pupils are equal, round, and reactive to light. Conjunctivae and EOM are normal               Nose: without d/c or deformity               Neck: Neck supple. Gross normal ROM               Cardiovascular: Normal rate and regular rhythm.                 Pulmonary/Chest: Effort normal and breath sounds without rales or wheezing.                Abd:  Soft, NT, ND, + BS, no organomegaly               Neurological: Pt is alert. At baseline orientation, motor grossly intact               Skin: Skin is warm. No rashes, no other new lesions, LE edema - none               Psychiatric: Pt behavior is normal without agitation   Micro: none  Cardiac tracings I have  personally interpreted today:  none  Pertinent Radiological findings (summarize): none   Lab Results  Component Value Date   WBC 5.7 02/02/2021   HGB 13.2 02/02/2021   HCT 41.2 02/02/2021   PLT 302.0 02/02/2021   GLUCOSE 82 02/02/2021   CHOL 234 (H) 02/02/2021   TRIG 78.0 02/02/2021   HDL 55.40 02/02/2021   LDLCALC 163 (H) 02/02/2021   ALT 11 02/02/2021   AST 20 02/02/2021   NA 139 02/02/2021   K 3.9 02/02/2021   CL 104 02/02/2021   CREATININE 0.92 02/02/2021   BUN 18 02/02/2021   CO2 30 02/02/2021   TSH 2.16 02/02/2021   Assessment/Plan:  Debbie Graves is a 51 y.o. Black or African American [2] female with  has a past medical history of Anxiety, Arthritis, COMMON MIGRAINE, Essential hypertension, benign, NEURITIS, OPTIC NOS, Neuromuscular disorder ([place]), and Rheumatoid arthritis(714.0).  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  Cathlean Cower, MD 06/18/2021 12:30 PM [facility]

## 2021-06-18 DIAGNOSIS — H9193 Unspecified hearing loss, bilateral: Secondary | ICD-10-CM | POA: Insufficient documentation

## 2021-06-18 NOTE — Assessment & Plan Note (Signed)
Mild to mod, related to eustachian dysfxn, for mucinex otc bid prn,  to f/u any worsening symptoms or concerns

## 2021-06-18 NOTE — Assessment & Plan Note (Signed)
Mild to mod, for OTC allegra and nasacort asd,  to f/u any worsening symptoms or concerns

## 2021-06-18 NOTE — Assessment & Plan Note (Signed)
Last vitamin D Lab Results  Component Value Date   VD25OH 18.51 (L) 02/02/2021   Low, reminded to start oral replacement

## 2021-06-18 NOTE — Assessment & Plan Note (Signed)
BP Readings from Last 3 Encounters:  06/15/21 120/70  02/02/21 118/70  11/02/20 (!) 148/88   Stable, pt to continue medical treatment benicar, hct

## 2021-07-28 ENCOUNTER — Encounter: Payer: BC Managed Care – PPO | Admitting: Internal Medicine

## 2021-11-17 DIAGNOSIS — Z111 Encounter for screening for respiratory tuberculosis: Secondary | ICD-10-CM | POA: Diagnosis not present

## 2021-11-17 DIAGNOSIS — M1991 Primary osteoarthritis, unspecified site: Secondary | ICD-10-CM | POA: Diagnosis not present

## 2021-11-17 DIAGNOSIS — M0589 Other rheumatoid arthritis with rheumatoid factor of multiple sites: Secondary | ICD-10-CM | POA: Diagnosis not present

## 2021-11-17 DIAGNOSIS — L409 Psoriasis, unspecified: Secondary | ICD-10-CM | POA: Diagnosis not present

## 2021-11-19 ENCOUNTER — Other Ambulatory Visit: Payer: Self-pay | Admitting: Internal Medicine

## 2021-11-23 ENCOUNTER — Encounter: Payer: Self-pay | Admitting: Internal Medicine

## 2021-12-15 ENCOUNTER — Ambulatory Visit (AMBULATORY_SURGERY_CENTER): Payer: Self-pay | Admitting: *Deleted

## 2021-12-15 VITALS — Ht 63.0 in | Wt 187.0 lb

## 2021-12-15 DIAGNOSIS — Z1211 Encounter for screening for malignant neoplasm of colon: Secondary | ICD-10-CM

## 2021-12-15 NOTE — Progress Notes (Unsigned)
No egg or soy allergy known to patient  No anesthesia, no trouble moving neck- no past surgeries No FH of Malignant Hyperthermia Pt is not on diet pills Pt is not on  home 02  Pt is not on blood thinners  Pt denies issues with constipation   Patient's chart reviewed by Osvaldo Angst CNRA prior to previsit and patient appropriate for the [place].  Previsit completed and red dot placed by patient's name on their procedure day (on provider's schedule).

## 2022-01-13 ENCOUNTER — Other Ambulatory Visit: Payer: Self-pay | Admitting: Internal Medicine

## 2022-01-13 NOTE — Telephone Encounter (Signed)
Please refill as per office routine med refill policy (all routine meds to be refilled for 3 mo or monthly (per pt preference) up to one year from last visit, then month to month grace period for 3 mo, then further med refills will have to be denied) ? ?

## 2022-01-17 ENCOUNTER — Encounter: Payer: BC Managed Care – PPO | Admitting: Internal Medicine

## 2022-01-17 ENCOUNTER — Telehealth: Payer: Self-pay | Admitting: Internal Medicine

## 2022-01-17 MED ORDER — OLMESARTAN MEDOXOMIL 40 MG PO TABS: 40.0000 mg | ORAL_TABLET | Freq: Every day | ORAL | 1 refills | Status: DC

## 2022-01-17 MED ORDER — ESCITALOPRAM OXALATE 10 MG PO TABS
10.0000 mg | ORAL_TABLET | Freq: Every day | ORAL | 1 refills | Status: DC
Start: 2022-01-17 — End: 2022-08-23

## 2022-01-17 NOTE — Telephone Encounter (Signed)
Caller & Relationship to patient: PT  Call back number: [phone]  Date of last office visit: 06/15/2021  Date of next office visit: 02/05/2022  Medication(s) to be refilled:  escitalopram (LEXAPRO) 10 MG tablet   olmesartan (BENICAR) 40 MG tablet   Preferred Pharmacy:   [facility], [address] [facility] [address]    PT also likes to note that they weren't sure how often they were seeing Dr.John for their refills. They believe it is every 6 months.  Also they would like to note that [facility] on [address] will be their primary pharmacy now!

## 2022-01-17 NOTE — Telephone Encounter (Signed)
Done erx 

## 2022-01-23 ENCOUNTER — Encounter: Payer: Self-pay | Admitting: Internal Medicine

## 2022-01-26 ENCOUNTER — Ambulatory Visit (AMBULATORY_SURGERY_CENTER): Payer: BC Managed Care – PPO | Admitting: Internal Medicine

## 2022-01-26 ENCOUNTER — Encounter: Payer: Self-pay | Admitting: Internal Medicine

## 2022-01-26 VITALS — BP 164/107 | HR 65 | Temp 98.0°F | Resp 15

## 2022-01-26 DIAGNOSIS — Z1211 Encounter for screening for malignant neoplasm of colon: Secondary | ICD-10-CM | POA: Diagnosis not present

## 2022-01-26 DIAGNOSIS — D120 Benign neoplasm of cecum: Secondary | ICD-10-CM

## 2022-01-26 DIAGNOSIS — D125 Benign neoplasm of sigmoid colon: Secondary | ICD-10-CM | POA: Diagnosis not present

## 2022-01-26 HISTORY — PX: COLONOSCOPY: SHX174

## 2022-01-26 MED ORDER — SODIUM CHLORIDE 0.9 % IV SOLN: 500.0000 mL | Freq: Once | INTRAVENOUS | Status: DC

## 2022-01-26 NOTE — Op Note (Signed)
[name] Procedure Date: 01/26/2022 10:00 AM MRN: [id number] Endoscopist: Gatha Mayer , MD, [id number] Age: 51 Referring MD:  Date of Birth: February 23, 1970 Gender: Female Account #: [id number] Procedure:                Colonoscopy Indications:              Screening for colorectal malignant neoplasm, This                            is the patient's first colonoscopy Medicines:                Monitored Anesthesia Care Procedure:                Pre-Anesthesia Assessment:                           - Prior to the procedure, a History and Physical                            was performed, and patient medications and                            allergies were reviewed. The patient's tolerance of                            previous anesthesia was also reviewed. The risks                            and benefits of the procedure and the sedation                            options and risks were discussed with the patient.                            All questions were answered, and informed consent                            was obtained. Prior Anticoagulants: The patient has                            taken no anticoagulant or antiplatelet agents. ASA                            Grade Assessment: II - A patient with mild systemic                            disease. After reviewing the risks and benefits,                            the patient was deemed in satisfactory condition to                            undergo the procedure.  After obtaining informed consent, the colonoscope                            was passed under direct vision. Throughout the                            procedure, the patient's blood pressure, pulse, and                            oxygen saturations were monitored continuously. The                            Olympus CF-HQ190L (Serial# [id number]) Colonoscope was                            introduced through  the anus and advanced to the the                            cecum, identified by appendiceal orifice and                            ileocecal valve. The colonoscopy was performed                            without difficulty. The patient tolerated the                            procedure well. The quality of the bowel                            preparation was excellent. The ileocecal valve,                            appendiceal orifice, and rectum were photographed.                            The bowel preparation used was Miralax via split                            dose instruction. Scope In: 10:18:12 AM Scope Out: 10:35:34 AM Scope Withdrawal Time: 0 hours 13 minutes 54 seconds  Total Procedure Duration: 0 hours 17 minutes 22 seconds  Findings:                 The perianal and digital rectal examinations were                            normal.                           Two sessile polyps were found in the proximal                            sigmoid colon and cecum. The polyps were 1 to 2 mm  in size. These polyps were removed with a cold                            snare. Resection and retrieval were complete.                            Verification of patient identification for the                            specimen was done. Estimated blood loss was minimal.                           The exam was otherwise without abnormality on                            direct and retroflexion views. Complications:            No immediate complications. Estimated Blood Loss:     Estimated blood loss was minimal. Impression:               - Two 1 to 2 mm polyps in the proximal sigmoid                            colon and in the cecum, removed with a cold snare.                            Resected and retrieved.                           - The examination was otherwise normal on direct                            and retroflexion views. Recommendation:           - Patient  has a contact number available for                            emergencies. The signs and symptoms of potential                            delayed complications were discussed with the                            patient. Return to normal activities tomorrow.                            Written discharge instructions were provided to the                            patient.                           - Resume previous diet.                           - Continue present medications.                           -  Repeat colonoscopy is recommended. The                            colonoscopy date will be determined after pathology                            results from today's exam become available for                            review. Gatha Mayer, MD 01/26/2022 10:43:42 AM This report has been signed electronically.

## 2022-01-26 NOTE — Progress Notes (Signed)
A and O x3. Report to RN. Tolerated MAC anesthesia well. 

## 2022-01-26 NOTE — Patient Instructions (Addendum)
There were two very tiny polyps removed. I will let you know pathology results and when to have another routine colonoscopy by mail and/or My Chart.  All else ok and these polyps are very tiny and look benign.  I appreciate the opportunity to care for you. Gatha Mayer, MD, [facility]  Handouts Provided:  Polyps  YOU HAD AN ENDOSCOPIC PROCEDURE TODAY AT [facility]:   Refer to the procedure report that was given to you for any specific questions about what was found during the examination.  If the procedure report does not answer your questions, please call your gastroenterologist to clarify.  If you requested that your care partner not be given the details of your procedure findings, then the procedure report has been included in a sealed envelope for you to review at your convenience later.  YOU SHOULD EXPECT: Some feelings of bloating in the abdomen. Passage of more gas than usual.  Walking can help get rid of the air that was put into your GI tract during the procedure and reduce the bloating. If you had a lower endoscopy (such as a colonoscopy or flexible sigmoidoscopy) you may notice spotting of blood in your stool or on the toilet paper. If you underwent a bowel prep for your procedure, you may not have a normal bowel movement for a few days.  Please Note:  You might notice some irritation and congestion in your nose or some drainage.  This is from the oxygen used during your procedure.  There is no need for concern and it should clear up in a day or so.  SYMPTOMS TO REPORT IMMEDIATELY:  Following lower endoscopy (colonoscopy or flexible sigmoidoscopy):  Excessive amounts of blood in the stool  Significant tenderness or worsening of abdominal pains  Swelling of the abdomen that is new, acute  Fever of 100F or higher  For urgent or emergent issues, a gastroenterologist can be reached at any hour by calling [phone]. Do not use MyChart messaging for urgent concerns.     DIET:  We do recommend a small meal at first, but then you may proceed to your regular diet.  Drink plenty of fluids but you should avoid alcoholic beverages for 24 hours.  ACTIVITY:  You should plan to take it easy for the rest of today and you should NOT DRIVE or use heavy machinery until tomorrow (because of the sedation medicines used during the test).    FOLLOW UP: Our staff will call the number listed on your records the next business day following your procedure.  We will call around 7:15- 8:00 am to check on you and address any questions or concerns that you may have regarding the information given to you following your procedure. If we do not reach you, we will leave a message.     If any biopsies were taken you will be contacted by phone or by letter within the next 1-3 weeks.  Please call us at [phone] if you have not heard about the biopsies in 3 weeks.    SIGNATURES/CONFIDENTIALITY: You and/or your care partner have signed paperwork which will be entered into your electronic medical record.  These signatures attest to the fact that that the information above on your After Visit Summary has been reviewed and is understood.  Full responsibility of the confidentiality of this discharge information lies with you and/or your care-partner.

## 2022-01-26 NOTE — Progress Notes (Signed)
[facility] History and Physical   Primary Care Physician:  Biagio Borg, MD   Reason for Procedure:   CRCA screening  Plan:    colonoscopy     HPI: Debbie Graves is a 51 y.o. female for first screening colonoscopy   Past Medical History:  Diagnosis Date   Anxiety    Arthritis    COMMON MIGRAINE    Eczema    Essential hypertension, benign    NEURITIS, OPTIC NOS    left eye   Neuromuscular disorder ([place])    Rheumatoid arthritis(714.0)     Past Surgical History:  Procedure Laterality Date   COLONOSCOPY  01/26/2022    Prior to Admission medications   Medication Sig Start Date End Date Taking? Authorizing Provider  Aspirin-Acetaminophen-Caffeine (EXCEDRIN PO) Take by mouth. PRN    [provider]  clobetasol ointment (TEMOVATE) 0.05 % 2 (two) times daily. 10/31/21   [provider]  cyclobenzaprine (FLEXERIL) 5 MG tablet Take 1 tablet (5 mg total) by mouth 3 (three) times daily as needed for muscle spasms. Patient not taking: Reported on 12/15/2021 08/30/20   Marrian Salvage, FNP  escitalopram (LEXAPRO) 10 MG tablet Take 1 tablet (10 mg total) by mouth daily. 01/17/22   Biagio Borg, MD  hydrochlorothiazide (MICROZIDE) 12.5 MG capsule Take 1 capsule (12.5 mg total) by mouth daily. 09/05/20 09/05/21  Biagio Borg, MD  hydrocortisone 2.5 % cream PRN 10/31/21   [provider]  levonorgestrel (MIRENA) 20 MCG/24HR IUD 1 each by Intrauterine route once.    [provider]  olmesartan (BENICAR) 40 MG tablet Take 1 tablet (40 mg total) by mouth daily. 01/17/22   Biagio Borg, MD  polyethylene glycol powder [facility]) 17 GM/SCOOP powder Take 1 Container by mouth once.    [provider]  triamcinolone cream (KENALOG) 0.1 % PRN 10/31/21   [provider]  XELJANZ XR 11 MG TB24 Take 1 tablet by mouth daily. 12/13/19   [provider]    Current Outpatient Medications  Medication Sig Dispense  Refill   escitalopram (LEXAPRO) 10 MG tablet Take 1 tablet (10 mg total) by mouth daily. 90 tablet 1   levonorgestrel (MIRENA) 20 MCG/24HR IUD 1 each by Intrauterine route once.     olmesartan (BENICAR) 40 MG tablet Take 1 tablet (40 mg total) by mouth daily. 90 tablet 1   XELJANZ XR 11 MG TB24 Take 1 tablet by mouth daily.     Aspirin-Acetaminophen-Caffeine (EXCEDRIN PO) Take by mouth. PRN     clobetasol ointment (TEMOVATE) 0.05 % 2 (two) times daily.     hydrochlorothiazide (MICROZIDE) 12.5 MG capsule Take 1 capsule (12.5 mg total) by mouth daily. (Patient not taking: Reported on 01/26/2022) 90 capsule 3   hydrocortisone 2.5 % cream PRN     triamcinolone cream (KENALOG) 0.1 % PRN     Current Facility-Administered Medications  Medication Dose Route Frequency Provider Last Rate Last Admin   0.9 %  sodium chloride infusion  500 mL Intravenous Once Gatha Mayer, MD        Allergies as of 01/26/2022 - Review Complete 01/26/2022  Allergen Reaction Noted   Dimetapp decongestant [pseudoephedrine]  06/08/2013   Pseudoephedrine-dm  11/18/2006    Family History  Problem Relation Age of Onset   Sarcoidosis Mother    Scleroderma Father    Pancreatic cancer Maternal Grandmother    Pancreatic cancer Paternal Grandmother    Heart disease Daughter    Coronary  artery disease Other    Hyperlipidemia Other    Hypertension Other    Lung cancer Other    Breast cancer Other    Scleroderma Other    Sarcoidosis Other    Colon cancer Neg Hx    Esophageal cancer Neg Hx    Rectal cancer Neg Hx    Stomach cancer Neg Hx     Social History   Socioeconomic History   Marital status: Married    Spouse name: Not on file   Number of children: Not on file   Years of education: Not on file   Highest education level: Not on file  Occupational History   Not on file  Tobacco Use   Smoking status: Never   Smokeless tobacco: Never  Vaping Use   Vaping Use: Never used  Substance and Sexual Activity    Alcohol use: Not Currently    Comment: Occasional   Drug use: No   Sexual activity: Yes    Birth control/protection: I.U.D.  Other Topics Concern   Not on file  Social History Narrative   Not on file   Social Determinants of Health   Financial Resource Strain: Not on file  Food Insecurity: Not on file  Transportation Needs: Not on file  Physical Activity: Not on file  Stress: Not on file  Social Connections: Not on file  Intimate Partner Violence: Not on file    Review of Systems:  All other review of systems negative except as mentioned in the HPI.  Physical Exam: Vital signs BP (!) 167/109   Pulse 73   Temp 98 F (36.7 C) (Temporal)   Resp (!) 5   SpO2 99%   General:   Alert,  Well-developed, well-nourished, pleasant and cooperative in NAD Lungs:  Clear throughout to auscultation.   Heart:  Regular rate and rhythm; no murmurs, clicks, rubs,  or gallops. Abdomen:  Soft, nontender and nondistended. Normal bowel sounds.   Neuro/Psych:  Alert and cooperative. Normal mood and affect. A and O x 3   '@[employer]'$  Simonne Maffucci, MD, [facility] [phone] (pager) 01/26/2022 10:08 AM@

## 2022-01-26 NOTE — Progress Notes (Signed)
Called to room to assist during endoscopic procedure.  Patient ID and intended procedure confirmed with present staff. Received instructions for my participation in the procedure from the performing physician.  
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
## 2022-01-31 ENCOUNTER — Telehealth: Payer: Self-pay | Admitting: *Deleted

## 2022-01-31 NOTE — Telephone Encounter (Signed)
  Follow up Call-     01/26/2022    9:37 AM  Call back number  Post procedure Call Back phone  # [phone]  Permission to leave phone message Yes     Patient questions:  Do you have a fever, pain , or abdominal swelling? No. Pain Score  0 *  Have you tolerated food without any problems? Yes.    Have you been able to return to your normal activities? Yes.    Do you have any questions about your discharge instructions: Diet   No. Medications  No. Follow up visit  No.  Do you have questions or concerns about your Care? No.  Actions: * If pain score is 4 or above: No action needed, pain <4.

## 2022-02-05 ENCOUNTER — Ambulatory Visit (INDEPENDENT_AMBULATORY_CARE_PROVIDER_SITE_OTHER): Payer: BC Managed Care – PPO | Admitting: Internal Medicine

## 2022-02-05 ENCOUNTER — Encounter: Payer: Self-pay | Admitting: Internal Medicine

## 2022-02-05 VITALS — BP 130/78 | HR 78 | Temp 97.7°F | Ht 63.0 in | Wt 184.0 lb

## 2022-02-05 DIAGNOSIS — Z0001 Encounter for general adult medical examination with abnormal findings: Secondary | ICD-10-CM

## 2022-02-05 DIAGNOSIS — E559 Vitamin D deficiency, unspecified: Secondary | ICD-10-CM

## 2022-02-05 DIAGNOSIS — E538 Deficiency of other specified B group vitamins: Secondary | ICD-10-CM

## 2022-02-05 DIAGNOSIS — J40 Bronchitis, not specified as acute or chronic: Secondary | ICD-10-CM

## 2022-02-05 DIAGNOSIS — I10 Essential (primary) hypertension: Secondary | ICD-10-CM | POA: Diagnosis not present

## 2022-02-05 DIAGNOSIS — E7800 Pure hypercholesterolemia, unspecified: Secondary | ICD-10-CM

## 2022-02-05 DIAGNOSIS — R739 Hyperglycemia, unspecified: Secondary | ICD-10-CM

## 2022-02-05 LAB — BASIC METABOLIC PANEL
BUN: 13 mg/dL (ref 6–23)
CO2: 28 mEq/L (ref 19–32)
Calcium: 8.6 mg/dL (ref 8.4–10.5)
Chloride: 104 mEq/L (ref 96–112)
Creatinine, Ser: 0.7 mg/dL (ref 0.40–1.20)
GFR: 101.63 mL/min (ref >60.00)
Glucose, Bld: 71 mg/dL (ref 70–99)
Potassium: 3.7 mEq/L (ref 3.5–5.1)
Sodium: 140 mEq/L (ref 135–145)

## 2022-02-05 LAB — URINALYSIS, ROUTINE W REFLEX MICROSCOPIC
Bilirubin Urine: NEGATIVE
Hgb urine dipstick: NEGATIVE
Leukocytes,Ua: NEGATIVE
Nitrite: NEGATIVE
Specific Gravity, Urine: 1.025 (ref 1.000–1.030)
Total Protein, Urine: NEGATIVE
Urine Glucose: NEGATIVE
Urobilinogen, UA: 0.2 (ref 0.0–1.0)
pH: 6.5 (ref 5.0–8.0)

## 2022-02-05 LAB — LIPID PANEL
Cholesterol: 196 mg/dL (ref 0–200)
HDL: 46.4 mg/dL (ref >39.00)
LDL Cholesterol: 114 mg/dL — ABNORMAL HIGH (ref 0–99)
NonHDL: 149.87
Total CHOL/HDL Ratio: 4
Triglycerides: 179 mg/dL — ABNORMAL HIGH (ref 0.0–149.0)
VLDL: 35.8 mg/dL (ref 0.0–40.0)

## 2022-02-05 LAB — HEPATIC FUNCTION PANEL
ALT: 8 U/L (ref 0–35)
AST: 15 U/L (ref 0–37)
Albumin: 4.1 g/dL (ref 3.5–5.2)
Alkaline Phosphatase: 67 U/L (ref 39–117)
Bilirubin, Direct: 0 mg/dL (ref 0.0–0.3)
Total Bilirubin: 0.3 mg/dL (ref 0.2–1.2)
Total Protein: 6.8 g/dL (ref 6.0–8.3)

## 2022-02-05 LAB — CBC WITH DIFFERENTIAL/PLATELET
Basophils Absolute: 0 10*3/uL (ref 0.0–0.1)
Basophils Relative: 0.3 % (ref 0.0–3.0)
Eosinophils Absolute: 0.1 10*3/uL (ref 0.0–0.7)
Eosinophils Relative: 1.3 % (ref 0.0–5.0)
HCT: 41.6 % (ref 36.0–46.0)
Hemoglobin: 13.6 g/dL (ref 12.0–15.0)
Lymphocytes Relative: 37.7 % (ref 12.0–46.0)
Lymphs Abs: 1.5 10*3/uL (ref 0.7–4.0)
MCHC: 32.6 g/dL (ref 30.0–36.0)
MCV: 88.9 fl (ref 78.0–100.0)
Monocytes Absolute: 0.5 10*3/uL (ref 0.1–1.0)
Monocytes Relative: 12.7 % — ABNORMAL HIGH (ref 3.0–12.0)
Neutro Abs: 2 10*3/uL (ref 1.4–7.7)
Neutrophils Relative %: 48 % (ref 43.0–77.0)
Platelets: 248 10*3/uL (ref 150.0–400.0)
RBC: 4.68 Mil/uL (ref 3.87–5.11)
RDW: 13.8 % (ref 11.5–15.5)
WBC: 4.1 10*3/uL (ref 4.0–10.5)

## 2022-02-05 LAB — VITAMIN D 25 HYDROXY (VIT D DEFICIENCY, FRACTURES): VITD: 16.45 ng/mL — ABNORMAL LOW (ref 30.00–100.00)

## 2022-02-05 LAB — TSH: TSH: 1.18 u[IU]/mL (ref 0.35–5.50)

## 2022-02-05 LAB — HEMOGLOBIN A1C: Hgb A1c MFr Bld: 5.6 % (ref 4.6–6.5)

## 2022-02-05 LAB — VITAMIN B12: Vitamin B-12: 365 pg/mL (ref 211–911)

## 2022-02-05 NOTE — Assessment & Plan Note (Signed)
Age and sex appropriate education and counseling updated with regular exercise and diet Referrals for preventative services - none needed Immunizations addressed - for covid booster and flu shot Smoking counseling  - none needed Evidence for depression or other mood disorder - none significant Most recent labs reviewed. I have personally reviewed and have noted: 1) the patient's medical and social history 2) The patient's current medications and supplements 3) The patient's height, weight, and BMI have been recorded in the chart

## 2022-02-05 NOTE — Assessment & Plan Note (Signed)
Last vitamin D Lab Results  Component Value Date   VD25OH 18.51 (L) 02/02/2021   Low, to start oral replacement

## 2022-02-05 NOTE — Patient Instructions (Signed)
You can also take Delsym OTC for cough, and/or Mucinex (or it's generic off brand) for congestion, and tylenol as needed for pain.  Please continue all other medications as before, and refills have been done if requested.  Please have the pharmacy call with any other refills you may need.  Please continue your efforts at being more active, low cholesterol diet, and weight control.  You are otherwise up to date with prevention measures today.  Please keep your appointments with your specialists as you may have planned  Please go to the LAB at the blood drawing area for the tests to be done  You will be contacted by phone if any changes need to be made immediately.  Otherwise, you will receive a letter about your results with an explanation, but please check with MyChart first.  Please remember to sign up for MyChart if you have not done so, as this will be important to you in the future with finding out test results, communicating by private email, and scheduling acute appointments online when needed.  Please make an Appointment to return for your 1 year visit, or sooner if needed

## 2022-02-05 NOTE — Assessment & Plan Note (Signed)
C/w viral illness, delcines cxr, ok for otc delsym prn

## 2022-02-05 NOTE — Assessment & Plan Note (Signed)
BP Readings from Last 3 Encounters:  02/05/22 130/78  01/26/22 (!) 164/107  06/15/21 120/70   Stable, pt to continue medical treatment benicar 40 gm qd

## 2022-02-05 NOTE — Assessment & Plan Note (Signed)
Lab Results  Component Value Date   LDLCALC 163 (H) 02/02/2021   Severe uncontrolled, pt to continue current low chol diet, and f/u lab today and consider statin for Ldl > 100

## 2022-02-05 NOTE — Progress Notes (Signed)
Patient ID: [name], female   DOB: 1970-09-26, 52 y.o.   MRN: [id number]         Chief Complaint:: wellness exam and acute cough, htn, hld, low vit d       HPI:  Debbie Graves is a 52 y.o. female here for wellness exam; declines covid booster and flu shot, o/w up to date                Also recent URI symptoms now improving, but still with scant prod cough and chest congestion without fever, sob or wheezing.  Pt denies chest pain, orthopnea, PND, increased LE swelling, palpitations, dizziness or syncope.   Pt denies polydipsia, polyuria, or new focal neuro s/s.    Pt denies fever, wt loss, night sweats, loss of appetite, or other constitutional symptoms     Wt Readings from Last 3 Encounters:  02/05/22 184 lb (83.5 kg)  12/15/21 187 lb (84.8 kg)  06/15/21 185 lb 6.4 oz (84.1 kg)   BP Readings from Last 3 Encounters:  02/05/22 130/78  01/26/22 (!) 164/107  06/15/21 120/70   Immunization History  Administered Date(s) Administered   Influenza,inj,Quad PF,6+ Mos 01/15/2019   Influenza-Unspecified 01/24/2021   PFIZER(Purple Top)SARS-COV-2 Vaccination 04/03/2019, 04/24/2019, 01/19/2020, 01/30/2021   Tdap 01/15/2019   Health Maintenance Due  Topic Date Due   PAP SMEAR-Modifier  03/22/2022      Past Medical History:  Diagnosis Date   Anxiety    Arthritis    COMMON MIGRAINE    Eczema    Essential hypertension, benign    NEURITIS, OPTIC NOS    left eye   Neuromuscular disorder ([facility])    Rheumatoid arthritis(714.0)    Past Surgical History:  Procedure Laterality Date   COLONOSCOPY  01/26/2022    reports that she has never smoked. She has never used smokeless tobacco. She reports that she does not currently use alcohol. She reports that she does not use drugs. family history includes Breast cancer in an other family member; Coronary artery disease in an other family member; Heart disease in her daughter; Hyperlipidemia in an other family member; Hypertension in an  other family member; Lung cancer in an other family member; Pancreatic cancer in her maternal grandmother and paternal grandmother; Sarcoidosis in her mother and another family member; Scleroderma in her father and another family member. Allergies  Allergen Reactions   Dimetapp Decongestant [Pseudoephedrine]     Reaction unknown   Pseudoephedrine-Dm     REACTION: not sure   Current Outpatient Medications on File Prior to Visit  Medication Sig Dispense Refill   Aspirin-Acetaminophen-Caffeine (EXCEDRIN PO) Take by mouth. PRN     clobetasol ointment (TEMOVATE) 0.05 % 2 (two) times daily.     escitalopram (LEXAPRO) 10 MG tablet Take 1 tablet (10 mg total) by mouth daily. 90 tablet 1   hydrocortisone 2.5 % cream PRN     levonorgestrel (MIRENA) 20 MCG/24HR IUD 1 each by Intrauterine route once.     olmesartan (BENICAR) 40 MG tablet Take 1 tablet (40 mg total) by mouth daily. 90 tablet 1   triamcinolone cream (KENALOG) 0.1 % PRN     XELJANZ XR 11 MG TB24 Take 1 tablet by mouth daily.     No current facility-administered medications on file prior to visit.        ROS:  All others reviewed and negative.  Objective        PE:  BP 130/78 (BP Location: Left Arm, Patient  Position: Sitting, Cuff Size: Large)   Pulse 78   Temp 97.7 F (36.5 C) (Oral)   Ht '5\' 3"'$  (1.6 m)   Wt 184 lb (83.5 kg)   SpO2 96%   BMI 32.59 kg/m                 Constitutional: Pt appears in NAD               HENT: Head: NCAT.                Right Ear: External ear normal.                 Left Ear: External ear normal.  Bilat tm's with mild erythema.  Max sinus areas non tender.  Pharynx with mild erythema, no exudate               Eyes: . Pupils are equal, round, and reactive to light. Conjunctivae and EOM are normal               Nose: without d/c or deformity               Neck: Neck supple. Gross normal ROM               Cardiovascular: Normal rate and regular rhythm.                 Pulmonary/Chest: Effort  normal and breath sounds without rales or wheezing.                Abd:  Soft, NT, ND, + BS, no organomegaly               Neurological: Pt is alert. At baseline orientation, motor grossly intact               Skin: Skin is warm. No rashes, no other new lesions, LE edema - none               Psychiatric: Pt behavior is normal without agitation   Micro: none  Cardiac tracings I have personally interpreted today:  none  Pertinent Radiological findings (summarize): none   Lab Results  Component Value Date   WBC 5.7 02/02/2021   HGB 13.2 02/02/2021   HCT 41.2 02/02/2021   PLT 302.0 02/02/2021   GLUCOSE 82 02/02/2021   CHOL 234 (H) 02/02/2021   TRIG 78.0 02/02/2021   HDL 55.40 02/02/2021   LDLCALC 163 (H) 02/02/2021   ALT 11 02/02/2021   AST 20 02/02/2021   NA 139 02/02/2021   K 3.9 02/02/2021   CL 104 02/02/2021   CREATININE 0.92 02/02/2021   BUN 18 02/02/2021   CO2 30 02/02/2021   TSH 2.16 02/02/2021   Assessment/Plan:  Debbie Graves is a 52 y.o. Black or African American [2] female with  has a past medical history of Anxiety, Arthritis, COMMON MIGRAINE, Eczema, Essential hypertension, benign, NEURITIS, OPTIC NOS, Neuromuscular disorder ([place]), and Rheumatoid arthritis(714.0).  Vitamin D deficiency Last vitamin D Lab Results  Component Value Date   VD25OH 18.51 (L) 02/02/2021   Low, to start oral replacement   Encounter for well adult exam with abnormal findings Age and sex appropriate education and counseling updated with regular exercise and diet Referrals for preventative services - none needed Immunizations addressed - for covid booster and flu shot Smoking counseling  - none needed Evidence for depression or other mood disorder - none significant Most recent labs reviewed. I have personally  reviewed and have noted: 1) the patient's medical and social history 2) The patient's current medications and supplements 3) The patient's height, weight, and BMI have  been recorded in the chart   HLD (hyperlipidemia) Lab Results  Component Value Date   LDLCALC 163 (H) 02/02/2021   Severe uncontrolled, pt to continue current low chol diet, and f/u lab today and consider statin for Ldl > 100   Hypertension, uncontrolled BP Readings from Last 3 Encounters:  02/05/22 130/78  01/26/22 (!) 164/107  06/15/21 120/70   Stable, pt to continue medical treatment benicar 40 gm qd   Bronchitis C/w viral illness, delcines cxr, ok for otc delsym prn  Followup: Return in about 1 year (around 02/06/2023).  Cathlean Cower, MD 02/05/2022 2:08 PM [facility]

## 2022-02-12 ENCOUNTER — Encounter: Payer: Self-pay | Admitting: Internal Medicine

## 2022-02-12 DIAGNOSIS — Z86010 Personal history of colonic polyps: Secondary | ICD-10-CM | POA: Insufficient documentation

## 2022-02-12 DIAGNOSIS — Z860101 Personal history of adenomatous and serrated colon polyps: Secondary | ICD-10-CM

## 2022-02-12 HISTORY — DX: Personal history of adenomatous and serrated colon polyps: Z86.0101

## 2022-03-09 DIAGNOSIS — M545 Low back pain, unspecified: Secondary | ICD-10-CM | POA: Diagnosis not present

## 2022-03-09 DIAGNOSIS — M1991 Primary osteoarthritis, unspecified site: Secondary | ICD-10-CM | POA: Diagnosis not present

## 2022-03-09 DIAGNOSIS — M0589 Other rheumatoid arthritis with rheumatoid factor of multiple sites: Secondary | ICD-10-CM | POA: Diagnosis not present

## 2022-03-09 DIAGNOSIS — L409 Psoriasis, unspecified: Secondary | ICD-10-CM | POA: Diagnosis not present

## 2022-06-01 DIAGNOSIS — M0589 Other rheumatoid arthritis with rheumatoid factor of multiple sites: Secondary | ICD-10-CM | POA: Diagnosis not present

## 2022-07-03 DIAGNOSIS — Z6831 Body mass index (BMI) 31.0-31.9, adult: Secondary | ICD-10-CM | POA: Diagnosis not present

## 2022-07-03 DIAGNOSIS — R319 Hematuria, unspecified: Secondary | ICD-10-CM | POA: Diagnosis not present

## 2022-07-03 DIAGNOSIS — Z01419 Encounter for gynecological examination (general) (routine) without abnormal findings: Secondary | ICD-10-CM | POA: Diagnosis not present

## 2022-07-03 DIAGNOSIS — Z1231 Encounter for screening mammogram for malignant neoplasm of breast: Secondary | ICD-10-CM | POA: Diagnosis not present

## 2022-07-30 ENCOUNTER — Other Ambulatory Visit: Payer: Self-pay

## 2022-07-30 MED ORDER — OLMESARTAN MEDOXOMIL 40 MG PO TABS: 40.0000 mg | ORAL_TABLET | Freq: Every day | ORAL | 1 refills | Status: DC

## 2022-08-23 ENCOUNTER — Telehealth: Payer: Self-pay | Admitting: Internal Medicine

## 2022-08-23 MED ORDER — ESCITALOPRAM OXALATE 10 MG PO TABS: 10.0000 mg | ORAL_TABLET | Freq: Every day | ORAL | 1 refills | Status: DC

## 2022-08-23 NOTE — Telephone Encounter (Signed)
Prescription Request  08/23/2022  LOV: 02/05/2022  What is the name of the medication or equipment? Lexapro   Have you contacted your pharmacy to request a refill? Yes   Which pharmacy would you like this sent to?  [id number] - [place], [place] - [address] AT [facility] OF [address] [place] [address] Phone: [phone] Fax: [phone]    Patient notified that their request is being sent to the clinical staff for review and that they should receive a response within 2 business days.   Please advise at Mobile [phone] (mobile)

## 2022-08-23 NOTE — Telephone Encounter (Signed)
Done erx 

## 2022-09-07 DIAGNOSIS — M1991 Primary osteoarthritis, unspecified site: Secondary | ICD-10-CM | POA: Diagnosis not present

## 2022-09-07 DIAGNOSIS — M545 Low back pain, unspecified: Secondary | ICD-10-CM | POA: Diagnosis not present

## 2022-09-07 DIAGNOSIS — M0589 Other rheumatoid arthritis with rheumatoid factor of multiple sites: Secondary | ICD-10-CM | POA: Diagnosis not present

## 2022-09-07 DIAGNOSIS — L409 Psoriasis, unspecified: Secondary | ICD-10-CM | POA: Diagnosis not present

## 2023-01-25 ENCOUNTER — Other Ambulatory Visit: Payer: Self-pay | Admitting: Internal Medicine

## 2023-01-25 ENCOUNTER — Other Ambulatory Visit: Payer: Self-pay

## 2023-01-31 ENCOUNTER — Other Ambulatory Visit: Payer: Self-pay | Admitting: Internal Medicine

## 2023-02-01 ENCOUNTER — Other Ambulatory Visit: Payer: Self-pay

## 2023-04-22 ENCOUNTER — Other Ambulatory Visit: Payer: Self-pay | Admitting: Internal Medicine

## 2023-04-22 DIAGNOSIS — I10 Essential (primary) hypertension: Secondary | ICD-10-CM

## 2023-04-22 DIAGNOSIS — F419 Anxiety disorder, unspecified: Secondary | ICD-10-CM

## 2023-04-22 NOTE — Telephone Encounter (Signed)
 Copied from CRM [id number]. Topic: Clinical - Medication Refill >> Apr 22, 2023  2:51 PM Lennart Pall wrote: Most Recent Primary Care Visit:  Provider: Corwin Levins  Department: [facility]  Visit Type: PHYSICAL  Date: 02/05/2022  Medication: olmesartan (BENICAR) 40 MG tablet ; escitalopram (LEXAPRO) 10 MG tablet  Has the patient contacted their pharmacy? Yes (Agent: If no, request that the patient contact the pharmacy for the refill. If patient does not wish to contact the pharmacy document the reason why and proceed with request.) (Agent: If yes, when and what did the pharmacy advise?)  Is this the correct pharmacy for this prescription? Yes If no, delete pharmacy and type the correct one.  This is the patient's preferred pharmacy:  [id number] - [place], [place] - [address] AT [facility] [address] [place] [address] Phone: [phone] Fax: [phone]   Has the prescription been filled recently? Yes  Is the patient out of the medication? Yes  Has the patient been seen for an appointment in the last year OR does the patient have an upcoming appointment? Yes  Can we respond through MyChart? Yes  Agent: Please be advised that Rx refills may take up to 3 business days. We ask that you follow-up with your pharmacy.

## 2023-04-23 MED ORDER — ESCITALOPRAM OXALATE 10 MG PO TABS: ORAL_TABLET | ORAL | 1 refills | Status: DC

## 2023-04-23 MED ORDER — OLMESARTAN MEDOXOMIL 40 MG PO TABS: ORAL_TABLET | ORAL | 1 refills | Status: DC

## 2023-10-03 ENCOUNTER — Other Ambulatory Visit: Payer: Self-pay | Admitting: Internal Medicine

## 2023-10-03 DIAGNOSIS — F419 Anxiety disorder, unspecified: Secondary | ICD-10-CM

## 2023-10-17 ENCOUNTER — Other Ambulatory Visit: Payer: Self-pay | Admitting: Internal Medicine

## 2023-10-17 DIAGNOSIS — I10 Essential (primary) hypertension: Secondary | ICD-10-CM

## 2023-10-25 ENCOUNTER — Other Ambulatory Visit: Payer: Self-pay | Admitting: Internal Medicine

## 2023-10-25 DIAGNOSIS — I10 Essential (primary) hypertension: Secondary | ICD-10-CM

## 2023-10-25 NOTE — Telephone Encounter (Signed)
 Copied from CRM #[id number]. Topic: Clinical - Medication Refill >> Oct 25, 2023 11:27 AM [name] wrote: Medication: olmesartan  (BENICAR ) 40 MG tablet  Has the patient contacted their pharmacy? Yes (Agent: If no, request that the patient contact the pharmacy for the refill. If patient does not wish to contact the pharmacy document the reason why and proceed with request.) (Agent: If yes, when and what did the pharmacy advise?)  This is the patient's preferred pharmacy:  [id number] - [place], [place] - [address] AT [facility] [address]  [place] [address] Phone: [phone] Fax: [phone]  Is this the correct pharmacy for this prescription? Yes If no, delete pharmacy and type the correct one.   Has the prescription been filled recently? No  Is the patient out of the medication? Yes  Has the patient been seen for an appointment in the last year OR does the patient have an upcoming appointment? No  Can we respond through MyChart? Yes  Agent: Please be advised that Rx refills may take up to 3 business days. We ask that you follow-up with your pharmacy.

## 2023-11-21 ENCOUNTER — Other Ambulatory Visit: Payer: Self-pay

## 2023-11-21 ENCOUNTER — Other Ambulatory Visit: Payer: Self-pay | Admitting: Internal Medicine

## 2023-11-21 DIAGNOSIS — I10 Essential (primary) hypertension: Secondary | ICD-10-CM

## 2024-01-16 ENCOUNTER — Other Ambulatory Visit: Payer: Self-pay | Admitting: Internal Medicine

## 2024-01-16 ENCOUNTER — Ambulatory Visit: Payer: Self-pay

## 2024-01-16 MED ORDER — CYCLOBENZAPRINE HCL 5 MG PO TABS: 5.0000 mg | ORAL_TABLET | Freq: Three times a day (TID) | ORAL | 1 refills | Status: AC | PRN

## 2024-01-16 NOTE — Telephone Encounter (Signed)
 Ok to try flexeril  5 mg tid prn - done erx

## 2024-01-16 NOTE — Telephone Encounter (Signed)
 FYI Only or Action Required?: Action required by provider: referral request and clinical question for provider.  Patient was last seen in primary care on 02/05/2022 by Norleen Lynwood ORN, MD.  Called Nurse Triage reporting Back Pain.  Symptoms began several weeks ago.  Interventions attempted: Prescription medications: arthritic meds.  Symptoms are: unchanged.  Triage Disposition: See PCP When Office is Open (Within 3 Days)  Patient/caregiver understands and will follow disposition?: No, wishes to speak with PCP   Copied from CRM #[id number]. Topic: Clinical - Red Word Triage >> Jan 16, 2024  2:15 PM Viola F wrote: Red Word that prompted transfer to Nurse Triage: Patient having lower left back pain Reason for Disposition  [1] MODERATE back pain (e.g., interferes with normal activities) AND [2] present > 3 days  Answer Assessment - Initial Assessment Questions No available appts today. Offered appt with alt prov, patient declines.  Patient requesting order for xray.  Advised call back or UC/ED if symptoms worsen. Patient verbalized understanding.  1. ONSET: When did the pain begin? (e.g., minutes, hours, days)     3 weeks 2. LOCATION: Where does it hurt? (upper, mid or lower back)     Lower left back 3. SEVERITY: How bad is the pain?  (e.g., Scale 1-10; mild, moderate, or severe)     Sharp pain, 4/10; no meds; just arthritis meds 4. PATTERN: Is the pain constant? (e.g., yes, no; constant, intermittent)      Comes and goes 5. RADIATION: Does the pain shoot into your legs or somewhere else?     no 6. CAUSE:  What do you think is causing the back pain?      unsure 7. BACK OVERUSE:  Any recent lifting of heavy objects, strenuous work or exercise?     Denies or no injuries 8. MEDICINES: What have you taken so far for the pain? (e.g., nothing, acetaminophen, NSAIDS)     Arthritis med 9. NEUROLOGIC SYMPTOMS: Do you have any weakness, numbness, or problems with  bowel/bladder control?     denies 10. OTHER SYMPTOMS: Do you have any other symptoms? (e.g., fever, abdomen pain, burning with urination, blood in urine)   Denies fever chills n/v, pain problems with urination, abd pain  Protocols used: Back Pain-A-AH

## 2024-02-06 ENCOUNTER — Ambulatory Visit: Admitting: Internal Medicine

## 2024-02-06 ENCOUNTER — Encounter: Payer: Self-pay | Admitting: Internal Medicine

## 2024-02-06 ENCOUNTER — Ambulatory Visit: Payer: Self-pay | Admitting: Internal Medicine

## 2024-02-06 VITALS — BP 144/80 | HR 77 | Temp 98.5°F | Ht 63.0 in | Wt 194.0 lb

## 2024-02-06 DIAGNOSIS — K130 Diseases of lips: Secondary | ICD-10-CM | POA: Insufficient documentation

## 2024-02-06 DIAGNOSIS — I10 Essential (primary) hypertension: Secondary | ICD-10-CM | POA: Diagnosis not present

## 2024-02-06 DIAGNOSIS — E559 Vitamin D deficiency, unspecified: Secondary | ICD-10-CM | POA: Diagnosis not present

## 2024-02-06 DIAGNOSIS — Z23 Encounter for immunization: Secondary | ICD-10-CM

## 2024-02-06 DIAGNOSIS — R7689 Other specified abnormal immunological findings in serum: Secondary | ICD-10-CM | POA: Insufficient documentation

## 2024-02-06 DIAGNOSIS — M2550 Pain in unspecified joint: Secondary | ICD-10-CM | POA: Insufficient documentation

## 2024-02-06 DIAGNOSIS — Z0001 Encounter for general adult medical examination with abnormal findings: Secondary | ICD-10-CM

## 2024-02-06 DIAGNOSIS — Z0000 Encounter for general adult medical examination without abnormal findings: Secondary | ICD-10-CM | POA: Diagnosis not present

## 2024-02-06 DIAGNOSIS — E7800 Pure hypercholesterolemia, unspecified: Secondary | ICD-10-CM

## 2024-02-06 DIAGNOSIS — R739 Hyperglycemia, unspecified: Secondary | ICD-10-CM

## 2024-02-06 DIAGNOSIS — F419 Anxiety disorder, unspecified: Secondary | ICD-10-CM | POA: Diagnosis not present

## 2024-02-06 DIAGNOSIS — M19049 Primary osteoarthritis, unspecified hand: Secondary | ICD-10-CM | POA: Insufficient documentation

## 2024-02-06 LAB — BASIC METABOLIC PANEL WITH GFR
BUN: 16 mg/dL (ref 6–23)
CO2: 29 meq/L (ref 19–32)
Calcium: 8.9 mg/dL (ref 8.4–10.5)
Chloride: 105 meq/L (ref 96–112)
Creatinine, Ser: 0.73 mg/dL (ref 0.40–1.20)
GFR: 95.29 mL/min
Glucose, Bld: 83 mg/dL (ref 70–99)
Potassium: 4.1 meq/L (ref 3.5–5.1)
Sodium: 138 meq/L (ref 135–145)

## 2024-02-06 LAB — LIPID PANEL
Cholesterol: 263 mg/dL — ABNORMAL HIGH (ref 28–200)
HDL: 72.7 mg/dL
LDL Cholesterol: 161 mg/dL — ABNORMAL HIGH (ref 10–99)
NonHDL: 190.17
Total CHOL/HDL Ratio: 4
Triglycerides: 144 mg/dL (ref 10.0–149.0)
VLDL: 28.8 mg/dL (ref 0.0–40.0)

## 2024-02-06 LAB — HEPATIC FUNCTION PANEL
ALT: 8 U/L (ref 3–35)
AST: 14 U/L (ref 5–37)
Albumin: 3.9 g/dL (ref 3.5–5.2)
Alkaline Phosphatase: 55 U/L (ref 39–117)
Bilirubin, Direct: 0 mg/dL — ABNORMAL LOW (ref 0.1–0.3)
Total Bilirubin: 0.3 mg/dL (ref 0.2–1.2)
Total Protein: 6.6 g/dL (ref 6.0–8.3)

## 2024-02-06 LAB — CBC WITH DIFFERENTIAL/PLATELET
Basophils Absolute: 0 K/uL (ref 0.0–0.1)
Basophils Relative: 0.6 % (ref 0.0–3.0)
Eosinophils Absolute: 0.1 K/uL (ref 0.0–0.7)
Eosinophils Relative: 1.6 % (ref 0.0–5.0)
HCT: 39.6 % (ref 36.0–46.0)
Hemoglobin: 12.9 g/dL (ref 12.0–15.0)
Lymphocytes Relative: 15.6 % (ref 12.0–46.0)
Lymphs Abs: 0.8 K/uL (ref 0.7–4.0)
MCHC: 32.6 g/dL (ref 30.0–36.0)
MCV: 88.3 fl (ref 78.0–100.0)
Monocytes Absolute: 0.5 K/uL (ref 0.1–1.0)
Monocytes Relative: 10.7 % (ref 3.0–12.0)
Neutro Abs: 3.5 K/uL (ref 1.4–7.7)
Neutrophils Relative %: 71.5 % (ref 43.0–77.0)
Platelets: 328 K/uL (ref 150.0–400.0)
RBC: 4.48 Mil/uL (ref 3.87–5.11)
RDW: 13.5 % (ref 11.5–15.5)
WBC: 5 K/uL (ref 4.0–10.5)

## 2024-02-06 LAB — HEMOGLOBIN A1C: Hgb A1c MFr Bld: 5.5 % (ref 4.6–6.5)

## 2024-02-06 LAB — URINALYSIS, ROUTINE W REFLEX MICROSCOPIC
Bilirubin Urine: NEGATIVE
Ketones, ur: NEGATIVE
Leukocytes,Ua: NEGATIVE
Nitrite: NEGATIVE
Specific Gravity, Urine: >=1.03 — AB (ref 1.000–1.030)
Total Protein, Urine: NEGATIVE
Urine Glucose: NEGATIVE
Urobilinogen, UA: 0.2 (ref 0.0–1.0)
pH: 6 (ref 5.0–8.0)

## 2024-02-06 LAB — VITAMIN D 25 HYDROXY (VIT D DEFICIENCY, FRACTURES): VITD: 25.32 ng/mL — ABNORMAL LOW (ref 30.00–100.00)

## 2024-02-06 LAB — TSH: TSH: 3.08 u[IU]/mL (ref 0.35–5.50)

## 2024-02-06 MED ORDER — ESCITALOPRAM OXALATE 10 MG PO TABS: ORAL_TABLET | ORAL | 1 refills | Status: AC

## 2024-02-06 MED ORDER — OLMESARTAN MEDOXOMIL 40 MG PO TABS: 40.0000 mg | ORAL_TABLET | Freq: Every day | ORAL | 3 refills | Status: AC

## 2024-02-06 MED ORDER — AMLODIPINE BESYLATE 5 MG PO TABS: 5.0000 mg | ORAL_TABLET | Freq: Every day | ORAL | 3 refills | Status: AC

## 2024-02-06 MED ORDER — CLOTRIMAZOLE-BETAMETHASONE 1-0.05 % EX CREA: 1.0000 | TOPICAL_CREAM | Freq: Every day | CUTANEOUS | 1 refills | Status: AC

## 2024-02-06 NOTE — Assessment & Plan Note (Signed)
 Left side, for lotrisone  cr asd

## 2024-02-06 NOTE — Assessment & Plan Note (Signed)
 Last vitamin D  Lab Results  Component Value Date   VD25OH 25.32 (L) 02/06/2024   Low, to start oral replacement

## 2024-02-06 NOTE — Patient Instructions (Addendum)
 You had the flu shot today  Please take all new medication as prescribed - the amlodipine  5 mg per day  Please continue to monitor the BP at home and call in 3 wks if the BP is still quite often more than 130/80  Ok to take Mucinex twice per day for the right ear fluid  Please take all new medication as prescribed - the cream for the rash  Please continue all other medications as before, and refills have been done if requested.  Please have the pharmacy call with any other refills you may need.  Please continue your efforts at being more active, low cholesterol diet, and weight control.  You are otherwise up to date with prevention measures today.  Please keep your appointments with your specialists as you may have planned  Please go to the LAB at the blood drawing area for the tests to be done  You will be contacted by phone if any changes need to be made immediately.  Otherwise, you will receive a letter about your results with an explanation, but please check with MyChart first.  Please make an Appointment to return in 3 months, or sooner if needed

## 2024-02-06 NOTE — Assessment & Plan Note (Signed)
 BP Readings from Last 3 Encounters:  02/06/24 (!) 144/80  02/05/22 130/78  01/26/22 (!) 164/107   uncontrolled, pt to continue medical treatment benicar  40 mg every day, and add amlodipine  5 mg qd

## 2024-02-06 NOTE — Assessment & Plan Note (Signed)
Overall stable, cont current med tx 

## 2024-02-06 NOTE — Assessment & Plan Note (Signed)
 Lab Results  Component Value Date   LDLCALC 161 (H) 02/06/2024   Uncontrolled, for lower chol diet, declines statin for now

## 2024-02-06 NOTE — Assessment & Plan Note (Signed)
Age and sex appropriate education and counseling updated with regular exercise and diet Referrals for preventative services - none needed Immunizations addressed - for flu shot today Smoking counseling  - none needed Evidence for depression or other mood disorder - none significant Most recent labs reviewed. I have personally reviewed and have noted: 1) the patient's medical and social history 2) The patient's current medications and supplements 3) The patient's height, weight, and BMI have been recorded in the chart  

## 2024-02-06 NOTE — Progress Notes (Signed)
 Patient ID: Debbie Graves, female   DOB: Oct 09, 1970, 54 y.o.   MRN: [id number]         Chief Complaint:: wellness exam and low vit d, htn, hld, anxiety       HPI:  Debbie Graves is a 54 y.o. female here for wellness exam; sees GYN yearly with pap and mammogram; declines hep B vax, prevnar and shignrix for now, today for flu shot, o/w up to date                        Also Pt denies chest pain, increased sob or doe, wheezing, orthopnea, PND, increased LE swelling, palpitations, dizziness or syncope.   Pt denies polydipsia, polyuria, or new focal neuro s/s.   BP has been mild to mod persistently elevated despite good med compliance.   Also has popping crackling like something moving in the right ear without fever, pain or d/c.   Gained some wt since last visit with less good diet.  Sees Dr Mai [place] rheum for RA on xelgans doing well.  Does also have rash to left angle of mouth, drools at night   Wt Readings from Last 3 Encounters:  02/06/24 194 lb (88 kg)  02/05/22 184 lb (83.5 kg)  12/15/21 187 lb (84.8 kg)   BP Readings from Last 3 Encounters:  02/06/24 (!) 144/80  02/05/22 130/78  01/26/22 (!) 164/107   Immunization History  Administered Date(s) Administered   Influenza, Seasonal, Injecte, Preservative Fre 02/06/2024   Influenza,inj,Quad PF,6+ Mos 01/15/2019   Influenza-Unspecified 01/24/2021   PFIZER(Purple Top)SARS-COV-2 Vaccination 04/03/2019, 04/24/2019, 01/19/2020, 01/30/2021   Tdap 01/15/2019   Health Maintenance Due  Topic Date Due   Hepatitis B Vaccines 19-59 Average Risk (1 of 3 - 19+ 3-dose series) Never done   Cervical Cancer Screening (HPV/Pap Cotest)  Never done   Mammogram  Never done   Pneumococcal Vaccine: 50+ Years (1 of 1 - PCV) Never done   Zoster Vaccines- Shingrix (1 of 2) Never done      Past Medical History:  Diagnosis Date   Anxiety    Arthritis    COMMON MIGRAINE    Eczema    Essential hypertension, benign    Hx of adenomatous polyp of  colon 02/12/2022   NEURITIS, OPTIC NOS    left eye   Neuromuscular disorder (HCC)    Rheumatoid arthritis(714.0)    Past Surgical History:  Procedure Laterality Date   COLONOSCOPY  01/26/2022    reports that she has never smoked. She has never used smokeless tobacco. She reports that she does not currently use alcohol. She reports that she does not use drugs. family history includes Breast cancer in an other family member; Coronary artery disease in an other family member; Heart disease in her daughter; Hyperlipidemia in an other family member; Hypertension in an other family member; Lung cancer in an other family member; Pancreatic cancer in her maternal grandmother and paternal grandmother; Sarcoidosis in her mother and another family member; Scleroderma in her father and another family member. Allergies[1] Medications Ordered Prior to Encounter[2]      ROS:  All others reviewed and negative.  Objective        PE:  BP (!) 144/80 (BP Location: Right Arm, Patient Position: Sitting, Cuff Size: Normal)   Pulse 77   Temp 98.5 F (36.9 C) (Oral)   Ht 5' 3 (1.6 m)   Wt 194 lb (88 kg)   LMP  (  LMP Unknown)   SpO2 98%   BMI 34.37 kg/m                 Constitutional: Pt appears in NAD               HENT: Head: NCAT.                Right Ear: External ear normal.  Right TM with trace erythema and fluid               Left Ear: External ear normal.                Eyes: . Pupils are equal, round, and reactive to light. Conjunctivae and EOM are normal               Nose: without d/c or deformity               Neck: Neck supple. Gross normal ROM               Cardiovascular: Normal rate and regular rhythm.                 Pulmonary/Chest: Effort normal and breath sounds without rales or wheezing.                Abd:  Soft, NT, ND, + BS, no organomegaly               Neurological: Pt is alert. At baseline orientation, motor grossly intact               Skin: Skin is warm. Left angle of mouth  with erythem non tender rash, no other new lesions, LE edema - none               Psychiatric: Pt behavior is normal without agitation   Micro: none  Cardiac tracings I have personally interpreted today:  none  Pertinent Radiological findings (summarize): none   Lab Results  Component Value Date   WBC 5.0 02/06/2024   HGB 12.9 02/06/2024   HCT 39.6 02/06/2024   PLT 328.0 02/06/2024   GLUCOSE 83 02/06/2024   CHOL 263 (H) 02/06/2024   TRIG 144.0 02/06/2024   HDL 72.70 02/06/2024   LDLCALC 161 (H) 02/06/2024   ALT 8 02/06/2024   AST 14 02/06/2024   NA 138 02/06/2024   K 4.1 02/06/2024   CL 105 02/06/2024   CREATININE 0.73 02/06/2024   BUN 16 02/06/2024   CO2 29 02/06/2024   TSH 3.08 02/06/2024   HGBA1C 5.5 02/06/2024   Assessment/Plan:  Debbie Graves is a 54 y.o. Black or African American [2] female with  has a past medical history of Anxiety, Arthritis, COMMON MIGRAINE, Eczema, Essential hypertension, benign, adenomatous polyp of colon (02/12/2022), NEURITIS, OPTIC NOS, Neuromuscular disorder (HCC), and Rheumatoid arthritis(714.0).  Encounter for well adult exam with abnormal findings Age and sex appropriate education and counseling updated with regular exercise and diet Referrals for preventative services - none needed Immunizations addressed - for flu shot today Smoking counseling  - none needed Evidence for depression or other mood disorder - none significant Most recent labs reviewed. I have personally reviewed and have noted: 1) the patient's medical and social history 2) The patient's current medications and supplements 3) The patient's height, weight, and BMI have been recorded in the chart   Vitamin D  deficiency Last vitamin D  Lab Results  Component Value Date   VD25OH 25.32 (L) 02/06/2024  Low, to start oral replacement   Hypertension, uncontrolled BP Readings from Last 3 Encounters:  02/06/24 (!) 144/80  02/05/22 130/78  01/26/22 (!) 164/107    uncontrolled, pt to continue medical treatment benicar  40 mg every day, and add amlodipine  5 mg qd   HLD (hyperlipidemia) Lab Results  Component Value Date   LDLCALC 161 (H) 02/06/2024   Uncontrolled, for lower chol diet, declines statin for now   Anxiety Overall stable, cont current med tx  Angular cheilitis Left side, for lotrisone  cr asd  Followup: Return in about 3 months (around 05/06/2024).  Lynwood Rush, MD 02/06/2024 8:54 PM [facility]     [1]  Allergies Allergen Reactions   Dimetapp Decongestant [Pseudoephedrine]     Reaction unknown   Pseudoephedrine-Dm     REACTION: not sure  [2]  Current Outpatient Medications on File Prior to Visit  Medication Sig Dispense Refill   Aspirin-Acetaminophen-Caffeine (EXCEDRIN PO) Take by mouth. PRN     clobetasol ointment (TEMOVATE) 0.05 % 2 (two) times daily.     cyclobenzaprine  (FLEXERIL ) 5 MG tablet Take 1 tablet (5 mg total) by mouth 3 (three) times daily as needed. 40 tablet 1   hydrocortisone 2.5 % cream PRN     levonorgestrel (MIRENA) 20 MCG/24HR IUD 1 each by Intrauterine route once.     triamcinolone cream (KENALOG) 0.1 % PRN     XELJANZ XR 11 MG TB24 Take 1 tablet by mouth daily.     No current facility-administered medications on file prior to visit.
